# Patient Record
Sex: Female | Born: 1970
Health system: Southern US, Community
[De-identification: ages and names within clinical notes are randomized; demographics above are authoritative.]

## PROBLEM LIST (undated history)

## (undated) DIAGNOSIS — I10 Essential (primary) hypertension: Secondary | ICD-10-CM

## (undated) HISTORY — PX: WRIST SURGERY: SHX841

---

## 2008-06-05 ENCOUNTER — Emergency Department (HOSPITAL_COMMUNITY): Admission: EM | Admit: 2008-06-05 | Discharge: 2008-06-05 | Payer: Self-pay | Admitting: Family Medicine

## 2008-09-01 ENCOUNTER — Other Ambulatory Visit: Admission: RE | Admit: 2008-09-01 | Discharge: 2008-09-01 | Payer: Self-pay | Admitting: Family Medicine

## 2010-07-26 LAB — HIV ANTIBODY (ROUTINE TESTING W REFLEX): HIV: NONREACTIVE

## 2010-07-26 LAB — WET PREP, GENITAL: Yeast Wet Prep HPF POC: NONE SEEN

## 2011-09-28 ENCOUNTER — Emergency Department (HOSPITAL_COMMUNITY): Payer: No Typology Code available for payment source

## 2011-09-28 ENCOUNTER — Emergency Department (HOSPITAL_COMMUNITY)
Admission: EM | Admit: 2011-09-28 | Discharge: 2011-09-29 | Disposition: A | Discharge status: Home / Self Care | Payer: No Typology Code available for payment source | Attending: Emergency Medicine | Admitting: Emergency Medicine

## 2011-09-28 ENCOUNTER — Encounter (HOSPITAL_COMMUNITY): Payer: Self-pay | Admitting: Emergency Medicine

## 2011-09-28 DIAGNOSIS — E119 Type 2 diabetes mellitus without complications: Secondary | ICD-10-CM | POA: Insufficient documentation

## 2011-09-28 DIAGNOSIS — I10 Essential (primary) hypertension: Secondary | ICD-10-CM | POA: Insufficient documentation

## 2011-09-28 DIAGNOSIS — M25539 Pain in unspecified wrist: Secondary | ICD-10-CM | POA: Insufficient documentation

## 2011-09-28 DIAGNOSIS — M25519 Pain in unspecified shoulder: Secondary | ICD-10-CM | POA: Insufficient documentation

## 2011-09-28 HISTORY — DX: Essential (primary) hypertension: I10
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
## 2011-09-29 MED ORDER — CYCLOBENZAPRINE HCL 10 MG PO TABS: 10.0000 mg | ORAL_TABLET | Freq: Two times a day (BID) | ORAL | Status: AC | PRN

## 2011-09-29 MED ORDER — IBUPROFEN 600 MG PO TABS: 600.0000 mg | ORAL_TABLET | Freq: Four times a day (QID) | ORAL | Status: AC | PRN
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
## 2011-09-30 NOTE — ED Provider Notes (Signed)
Medical screening examination/treatment/procedure(s) were performed by non-physician practitioner and as supervising physician I was immediately available for consultation/collaboration.  Juliet Rude. Rubin Payor, MD 09/30/11 [phone]

## 2012-07-11 ENCOUNTER — Other Ambulatory Visit: Payer: Self-pay

## 2012-07-11 DIAGNOSIS — Z1231 Encounter for screening mammogram for malignant neoplasm of breast: Secondary | ICD-10-CM

## 2012-08-16 ENCOUNTER — Ambulatory Visit
Admission: RE | Admit: 2012-08-16 | Discharge: 2012-08-16 | Disposition: A | Discharge status: Home / Self Care | Payer: Self-pay | Source: Ambulatory Visit

## 2012-08-16 DIAGNOSIS — Z1231 Encounter for screening mammogram for malignant neoplasm of breast: Secondary | ICD-10-CM

## 2014-05-29 ENCOUNTER — Other Ambulatory Visit: Payer: Self-pay | Admitting: Sports Medicine

## 2014-05-29 DIAGNOSIS — M25561 Pain in right knee: Secondary | ICD-10-CM

## 2014-06-07 ENCOUNTER — Ambulatory Visit
Admission: RE | Admit: 2014-06-07 | Discharge: 2014-06-07 | Disposition: A | Discharge status: Home / Self Care | Payer: Managed Care, Other (non HMO) | Source: Ambulatory Visit | Attending: Sports Medicine | Admitting: Sports Medicine

## 2014-06-07 DIAGNOSIS — M25561 Pain in right knee: Secondary | ICD-10-CM

## 2014-09-09 ENCOUNTER — Other Ambulatory Visit: Payer: Self-pay | Admitting: Family Medicine

## 2014-09-09 ENCOUNTER — Other Ambulatory Visit (HOSPITAL_COMMUNITY)
Admission: RE | Admit: 2014-09-09 | Discharge: 2014-09-09 | Disposition: A | Discharge status: Home / Self Care | Payer: Managed Care, Other (non HMO) | Source: Ambulatory Visit | Attending: Family Medicine | Admitting: Family Medicine

## 2014-09-09 DIAGNOSIS — N852 Hypertrophy of uterus: Secondary | ICD-10-CM

## 2014-09-09 DIAGNOSIS — Z1231 Encounter for screening mammogram for malignant neoplasm of breast: Secondary | ICD-10-CM

## 2014-09-09 DIAGNOSIS — Z01419 Encounter for gynecological examination (general) (routine) without abnormal findings: Secondary | ICD-10-CM | POA: Insufficient documentation

## 2014-09-14 ENCOUNTER — Ambulatory Visit
Admission: RE | Admit: 2014-09-14 | Discharge: 2014-09-14 | Disposition: A | Discharge status: Home / Self Care | Payer: Managed Care, Other (non HMO) | Source: Ambulatory Visit | Attending: Family Medicine | Admitting: Family Medicine

## 2014-09-14 DIAGNOSIS — Z1231 Encounter for screening mammogram for malignant neoplasm of breast: Secondary | ICD-10-CM

## 2014-09-14 DIAGNOSIS — N852 Hypertrophy of uterus: Secondary | ICD-10-CM

## 2014-09-14 LAB — CYTOLOGY - PAP

## 2015-02-03 ENCOUNTER — Other Ambulatory Visit: Payer: Self-pay | Admitting: Family Medicine

## 2015-02-03 DIAGNOSIS — N63 Unspecified lump in unspecified breast: Secondary | ICD-10-CM

## 2015-02-09 ENCOUNTER — Ambulatory Visit
Admission: RE | Admit: 2015-02-09 | Discharge: 2015-02-09 | Disposition: A | Discharge status: Home / Self Care | Payer: Managed Care, Other (non HMO) | Source: Ambulatory Visit | Attending: Family Medicine | Admitting: Family Medicine

## 2015-02-09 DIAGNOSIS — N63 Unspecified lump in unspecified breast: Secondary | ICD-10-CM

## 2015-06-14 MED FILL — FARXIGA 5 MG TABLET: 5 | 30 days supply | Qty: 30 | Fill #0

## 2015-06-18 DIAGNOSIS — E119 Type 2 diabetes mellitus without complications: Secondary | ICD-10-CM | POA: Diagnosis not present

## 2015-06-18 DIAGNOSIS — Z7984 Long term (current) use of oral hypoglycemic drugs: Secondary | ICD-10-CM | POA: Diagnosis not present

## 2015-07-02 MED FILL — FLUCONAZOLE 150 MG TABLET: 150 | 1 days supply | Qty: 1 | Fill #0

## 2015-07-19 MED FILL — ROSUVASTATIN CALCIUM 20 MG: 20 | 90 days supply | Qty: 90 | Fill #0

## 2015-07-19 MED FILL — metFORMIN HCL 1000 MG TABS: 1000 | 90 days supply | Qty: 180 | Fill #0

## 2015-07-19 MED FILL — FARXIGA 5 MG TABLET: 5 | 90 days supply | Qty: 90 | Fill #0

## 2015-07-19 MED FILL — GLIMEPIRIDE 2 MG TABLET: 2 | 90 days supply | Qty: 90 | Fill #0

## 2015-07-19 MED FILL — TRIAMTERENE-HCTZ 37.5-25 MG: 37.5-25 | 90 days supply | Qty: 90 | Fill #0

## 2015-09-10 DIAGNOSIS — E7800 Pure hypercholesterolemia, unspecified: Secondary | ICD-10-CM | POA: Diagnosis not present

## 2015-09-10 DIAGNOSIS — I10 Essential (primary) hypertension: Secondary | ICD-10-CM | POA: Diagnosis not present

## 2015-09-10 DIAGNOSIS — M25569 Pain in unspecified knee: Secondary | ICD-10-CM | POA: Diagnosis not present

## 2015-09-10 DIAGNOSIS — E669 Obesity, unspecified: Secondary | ICD-10-CM | POA: Diagnosis not present

## 2015-09-10 DIAGNOSIS — E119 Type 2 diabetes mellitus without complications: Secondary | ICD-10-CM | POA: Diagnosis not present

## 2015-09-10 DIAGNOSIS — Z0000 Encounter for general adult medical examination without abnormal findings: Secondary | ICD-10-CM | POA: Diagnosis not present

## 2015-09-10 DIAGNOSIS — D259 Leiomyoma of uterus, unspecified: Secondary | ICD-10-CM | POA: Diagnosis not present

## 2015-09-10 DIAGNOSIS — Z7984 Long term (current) use of oral hypoglycemic drugs: Secondary | ICD-10-CM | POA: Diagnosis not present

## 2015-09-10 MED FILL — TRUE METRIX GLUCOSE TEST ST: 90 days supply | Qty: 100 | Fill #0

## 2015-09-10 MED FILL — TRUEplus LANCETS 30G MISC: 90 days supply | Qty: 100 | Fill #0

## 2015-11-01 MED FILL — ROSUVASTATIN CALCIUM 20 MG: 20 | 90 days supply | Qty: 90 | Fill #1

## 2015-11-01 MED FILL — TRIAMTERENE/HCTZ 37.5/25 TB: 37.5-25 | 90 days supply | Qty: 90 | Fill #1

## 2015-11-01 MED FILL — GLIMEPIRIDE 2 MG TABLET: 2 | 90 days supply | Qty: 90 | Fill #1

## 2015-11-04 MED FILL — FARXIGA 5 MG TABLET: 5 | 90 days supply | Qty: 90 | Fill #1

## 2016-02-03 MED FILL — ROSUVASTATIN CALCIUM 20 MG: 20 | 90 days supply | Qty: 90 | Fill #0

## 2016-02-03 MED FILL — GLIMEPIRIDE 2 MG TABLET: 2 | 90 days supply | Qty: 90 | Fill #0

## 2016-02-03 MED FILL — TRIAMTERENE-HCTZ 37.5-25 MG: 37.5-25 | 90 days supply | Qty: 90 | Fill #0

## 2016-02-03 MED FILL — FARXIGA 5 MG TABLET: 5 | 90 days supply | Qty: 90 | Fill #1

## 2016-02-09 DIAGNOSIS — M7710 Lateral epicondylitis, unspecified elbow: Secondary | ICD-10-CM | POA: Diagnosis not present

## 2016-02-09 MED FILL — ETODOLAC 400 MG TABLET: 400 | 30 days supply | Qty: 60 | Fill #0

## 2016-02-29 MED FILL — metFORMIN HCL 1000 MG TABS: 1000 | 90 days supply | Qty: 180 | Fill #1

## 2016-03-14 DIAGNOSIS — M7710 Lateral epicondylitis, unspecified elbow: Secondary | ICD-10-CM | POA: Diagnosis not present

## 2016-03-14 DIAGNOSIS — E669 Obesity, unspecified: Secondary | ICD-10-CM | POA: Diagnosis not present

## 2016-03-14 DIAGNOSIS — E119 Type 2 diabetes mellitus without complications: Secondary | ICD-10-CM | POA: Diagnosis not present

## 2016-03-14 DIAGNOSIS — E7800 Pure hypercholesterolemia, unspecified: Secondary | ICD-10-CM | POA: Diagnosis not present

## 2016-03-14 DIAGNOSIS — I10 Essential (primary) hypertension: Secondary | ICD-10-CM | POA: Diagnosis not present

## 2016-04-17 MED FILL — FARXIGA 10 MG TABLET: 10 | 90 days supply | Qty: 90 | Fill #0

## 2016-05-04 MED FILL — GLIMEPIRIDE 2 MG TABLET: 2 | 90 days supply | Qty: 90 | Fill #1

## 2016-05-04 MED FILL — ROSUVASTATIN CALCIUM 20 MG: 20 | 90 days supply | Qty: 90 | Fill #1

## 2016-05-05 MED FILL — TRIAMTERENE-HCTZ 37.5-25 MG: 37.5-25 | 90 days supply | Qty: 90 | Fill #1

## 2016-06-21 DIAGNOSIS — H524 Presbyopia: Secondary | ICD-10-CM | POA: Diagnosis not present

## 2016-06-22 MED FILL — metFORMIN HCL 1000 MG TABS: 1000 | 90 days supply | Qty: 180 | Fill #0

## 2016-07-17 MED FILL — FARXIGA 10 MG TABLET: 10 | 90 days supply | Qty: 90 | Fill #1

## 2016-08-03 MED FILL — TRIAMTERENE-HCTZ 37.5-25 MG: 37.5-25 | 90 days supply | Qty: 90 | Fill #2

## 2016-08-03 MED FILL — GLIMEPIRIDE 2 MG TABLET: 2 | 90 days supply | Qty: 90 | Fill #2

## 2016-08-03 MED FILL — ROSUVASTATIN CALCIUM 20 MG: 20 | 90 days supply | Qty: 90 | Fill #2

## 2016-08-04 MED FILL — ACCU-CHEK GUIDE TEST STRIP: 90 days supply | Qty: 100 | Fill #0

## 2016-09-14 DIAGNOSIS — E7800 Pure hypercholesterolemia, unspecified: Secondary | ICD-10-CM | POA: Diagnosis not present

## 2016-09-14 DIAGNOSIS — N951 Menopausal and female climacteric states: Secondary | ICD-10-CM | POA: Diagnosis not present

## 2016-09-14 DIAGNOSIS — E669 Obesity, unspecified: Secondary | ICD-10-CM | POA: Diagnosis not present

## 2016-09-14 DIAGNOSIS — I10 Essential (primary) hypertension: Secondary | ICD-10-CM | POA: Diagnosis not present

## 2016-09-14 DIAGNOSIS — Z7984 Long term (current) use of oral hypoglycemic drugs: Secondary | ICD-10-CM | POA: Diagnosis not present

## 2016-09-14 DIAGNOSIS — E1165 Type 2 diabetes mellitus with hyperglycemia: Secondary | ICD-10-CM | POA: Diagnosis not present

## 2016-09-26 MED FILL — metFORMIN HCL 1000 MG TABS: 1000 | 90 days supply | Qty: 180 | Fill #1

## 2016-10-17 MED FILL — FARXIGA 10 MG TABLET: 10 | 90 days supply | Qty: 90 | Fill #0

## 2016-11-01 MED FILL — ROSUVASTATIN CALCIUM 20 MG: 20 | 90 days supply | Qty: 90 | Fill #0

## 2016-11-01 MED FILL — ACCU-CHEK GUIDE TEST STRIP: 90 days supply | Qty: 100 | Fill #1

## 2016-11-01 MED FILL — GLIMEPIRIDE 2 MG TABLET: 2 | 90 days supply | Qty: 90 | Fill #0

## 2016-11-01 MED FILL — TRIAMTERENE-HCTZ 37.5-25 MG: 37.5-25 | 90 days supply | Qty: 90 | Fill #0

## 2016-12-15 DIAGNOSIS — L259 Unspecified contact dermatitis, unspecified cause: Secondary | ICD-10-CM | POA: Diagnosis not present

## 2016-12-15 MED FILL — MOMETASONE FUROATE 0.1% CRM: 0.1 | 30 days supply | Qty: 30 | Fill #0

## 2017-01-05 MED FILL — metFORMIN HCL 1000 MG TABS: 1000 | 90 days supply | Qty: 180 | Fill #2

## 2017-01-15 MED FILL — FARXIGA 10 MG TABLET: 10 | 90 days supply | Qty: 90 | Fill #1

## 2017-01-29 MED FILL — ROSUVASTATIN CALCIUM 20 MG: 20 | 90 days supply | Qty: 90 | Fill #1

## 2017-01-29 MED FILL — TRIAMTERENE/HCTZ 37.5/25 TB: 37.5-25 | 90 days supply | Qty: 90 | Fill #1

## 2017-01-29 MED FILL — GLIMEPIRIDE 2 MG TABLET: 2 | 90 days supply | Qty: 90 | Fill #1

## 2017-03-16 DIAGNOSIS — E119 Type 2 diabetes mellitus without complications: Secondary | ICD-10-CM | POA: Diagnosis not present

## 2017-03-16 DIAGNOSIS — E7800 Pure hypercholesterolemia, unspecified: Secondary | ICD-10-CM | POA: Diagnosis not present

## 2017-03-16 DIAGNOSIS — I10 Essential (primary) hypertension: Secondary | ICD-10-CM | POA: Diagnosis not present

## 2017-03-16 DIAGNOSIS — Z0000 Encounter for general adult medical examination without abnormal findings: Secondary | ICD-10-CM | POA: Diagnosis not present

## 2017-03-16 DIAGNOSIS — R232 Flushing: Secondary | ICD-10-CM | POA: Diagnosis not present

## 2017-03-16 DIAGNOSIS — E669 Obesity, unspecified: Secondary | ICD-10-CM | POA: Diagnosis not present

## 2017-04-20 MED FILL — metFORMIN HCL 1000 MG TABS: 1000 | 90 days supply | Qty: 180 | Fill #0

## 2017-04-20 MED FILL — FARXIGA 10 MG TABLET: 10 | 90 days supply | Qty: 90 | Fill #0

## 2017-04-20 MED FILL — GLIMEPIRIDE 2 MG TABLET: 2 | 90 days supply | Qty: 90 | Fill #0

## 2017-04-20 MED FILL — ROSUVASTATIN CALCIUM 20 MG: 20 | 90 days supply | Qty: 90 | Fill #0

## 2017-05-07 MED FILL — TRIAMTERENE/HCTZ 37.5/25 TB: 37.5-25 | 90 days supply | Qty: 90 | Fill #0

## 2017-06-27 MED FILL — ACCU-CHEK GUIDE TEST STRIP: 90 days supply | Qty: 100 | Fill #0

## 2017-07-16 MED FILL — FARXIGA 10 MG TABLET: 10 | 90 days supply | Qty: 90 | Fill #1

## 2017-08-01 MED FILL — ROSUVASTATIN CALCIUM 20 MG: 20 | 90 days supply | Qty: 90 | Fill #1

## 2017-08-01 MED FILL — GLIMEPIRIDE 2 MG TABLET: 2 | 90 days supply | Qty: 90 | Fill #1

## 2017-08-01 MED FILL — metFORMIN HCL 1000 MG TABS: 1000 | 90 days supply | Qty: 180 | Fill #1

## 2017-08-01 MED FILL — TRIAMTERENE-HCTZ 37.5-25 MG: 37.5-25 | 90 days supply | Qty: 90 | Fill #1

## 2017-08-02 DIAGNOSIS — H1010 Acute atopic conjunctivitis, unspecified eye: Secondary | ICD-10-CM | POA: Diagnosis not present

## 2017-08-02 MED FILL — PREDNISOLONE AC 1% EYE DROP: 1 | 50 days supply | Qty: 5 | Fill #0

## 2017-08-24 DIAGNOSIS — H524 Presbyopia: Secondary | ICD-10-CM | POA: Diagnosis not present

## 2017-09-20 DIAGNOSIS — E119 Type 2 diabetes mellitus without complications: Secondary | ICD-10-CM | POA: Diagnosis not present

## 2017-09-20 DIAGNOSIS — E7800 Pure hypercholesterolemia, unspecified: Secondary | ICD-10-CM | POA: Diagnosis not present

## 2017-09-20 DIAGNOSIS — I10 Essential (primary) hypertension: Secondary | ICD-10-CM | POA: Diagnosis not present

## 2017-09-20 DIAGNOSIS — E669 Obesity, unspecified: Secondary | ICD-10-CM | POA: Diagnosis not present

## 2017-10-18 MED FILL — FARXIGA 10 MG TABLET: 10 | 90 days supply | Qty: 90 | Fill #0

## 2017-11-01 MED FILL — TRIAMTERENE/HCTZ 37.5/25 CP: 37.5-25 | 90 days supply | Qty: 90 | Fill #0

## 2017-11-01 MED FILL — ROSUVASTATIN CALCIUM 20 MG: 20 | 90 days supply | Qty: 90 | Fill #0

## 2017-11-01 MED FILL — GLIMEPIRIDE 2 MG TABLET: 2 | 90 days supply | Qty: 90 | Fill #0

## 2017-11-02 MED FILL — metFORMIN HCL 1000 MG TABS: 1000 | 90 days supply | Qty: 180 | Fill #0

## 2017-11-13 MED FILL — ACCU-CHEK GUIDE STRP: 90 days supply | Qty: 100 | Fill #1

## 2017-12-04 ENCOUNTER — Other Ambulatory Visit: Payer: Self-pay | Admitting: Family Medicine

## 2017-12-04 ENCOUNTER — Other Ambulatory Visit (HOSPITAL_COMMUNITY)
Admission: RE | Admit: 2017-12-04 | Discharge: 2017-12-04 | Disposition: A | Discharge status: Home / Self Care | Payer: 59 | Source: Ambulatory Visit | Attending: Family Medicine | Admitting: Family Medicine

## 2017-12-04 DIAGNOSIS — D259 Leiomyoma of uterus, unspecified: Secondary | ICD-10-CM | POA: Diagnosis not present

## 2017-12-04 DIAGNOSIS — N951 Menopausal and female climacteric states: Secondary | ICD-10-CM | POA: Diagnosis not present

## 2017-12-04 DIAGNOSIS — Z01419 Encounter for gynecological examination (general) (routine) without abnormal findings: Secondary | ICD-10-CM | POA: Insufficient documentation

## 2017-12-04 DIAGNOSIS — Z124 Encounter for screening for malignant neoplasm of cervix: Secondary | ICD-10-CM | POA: Diagnosis not present

## 2017-12-04 MED FILL — PREMPRO 0.625-2.5 MG TABLET: 0.625-2.5 | 28 days supply | Qty: 28 | Fill #0

## 2017-12-07 LAB — CYTOLOGY - PAP
Adequacy: ABSENT
DIAGNOSIS: NEGATIVE

## 2017-12-31 MED FILL — PREMPRO 0.625-2.5 MG TABLET: 0.625-2.5 | 28 days supply | Qty: 28 | Fill #1

## 2018-01-14 MED FILL — FARXIGA 10 MG TABLET: 10 | 90 days supply | Qty: 90 | Fill #1

## 2018-01-28 MED FILL — GLIMEPIRIDE 2 MG TABLET: 2 | 90 days supply | Qty: 90 | Fill #1

## 2018-01-28 MED FILL — TRIAMTERENE/HCTZ 37.5/25 CP: 37.5-25 | 90 days supply | Qty: 90 | Fill #1

## 2018-01-28 MED FILL — PREMPRO 0.625-2.5 MG TABLET: 0.625-2.5 | 28 days supply | Qty: 28 | Fill #2

## 2018-01-28 MED FILL — ROSUVASTATIN CALCIUM 20 MG: 20 | 90 days supply | Qty: 90 | Fill #1

## 2018-02-18 MED FILL — metFORMIN HCL 1000 MG TABS: 1000 | 90 days supply | Qty: 180 | Fill #1

## 2018-02-25 MED FILL — PREMPRO 0.625-2.5 MG TABLET: 0.625-2.5 | 28 days supply | Qty: 28 | Fill #3

## 2018-03-26 DIAGNOSIS — Z0000 Encounter for general adult medical examination without abnormal findings: Secondary | ICD-10-CM | POA: Diagnosis not present

## 2018-03-26 DIAGNOSIS — E119 Type 2 diabetes mellitus without complications: Secondary | ICD-10-CM | POA: Diagnosis not present

## 2018-03-26 DIAGNOSIS — E669 Obesity, unspecified: Secondary | ICD-10-CM | POA: Diagnosis not present

## 2018-03-26 DIAGNOSIS — D259 Leiomyoma of uterus, unspecified: Secondary | ICD-10-CM | POA: Diagnosis not present

## 2018-03-26 DIAGNOSIS — I10 Essential (primary) hypertension: Secondary | ICD-10-CM | POA: Diagnosis not present

## 2018-03-26 DIAGNOSIS — N951 Menopausal and female climacteric states: Secondary | ICD-10-CM | POA: Diagnosis not present

## 2018-03-26 DIAGNOSIS — E7800 Pure hypercholesterolemia, unspecified: Secondary | ICD-10-CM | POA: Diagnosis not present

## 2018-03-26 MED FILL — PREMPRO 0.625-2.5 MG TABLET: 0.625-2.5 | 28 days supply | Qty: 28 | Fill #4

## 2018-03-27 ENCOUNTER — Other Ambulatory Visit: Payer: Self-pay | Admitting: Family Medicine

## 2018-03-27 DIAGNOSIS — Z1231 Encounter for screening mammogram for malignant neoplasm of breast: Secondary | ICD-10-CM

## 2018-04-12 MED FILL — FARXIGA 10 MG TABLET: 10 | 90 days supply | Qty: 90 | Fill #2

## 2018-04-22 MED FILL — GLIMEPIRIDE 2 MG TABLET: 2 | 90 days supply | Qty: 90 | Fill #2

## 2018-04-22 MED FILL — TRIAMTERENE/HCTZ 37.5/25 CP: 37.5-25 | 90 days supply | Qty: 90 | Fill #2

## 2018-04-22 MED FILL — PREMPRO 0.625-2.5 MG TABLET: 0.625-2.5 | 28 days supply | Qty: 28 | Fill #5

## 2018-04-22 MED FILL — ROSUVASTATIN CALCIUM 20 MG: 20 | 90 days supply | Qty: 90 | Fill #2

## 2018-05-03 ENCOUNTER — Ambulatory Visit
Admission: RE | Admit: 2018-05-03 | Discharge: 2018-05-03 | Disposition: A | Discharge status: Home / Self Care | Payer: 59 | Source: Ambulatory Visit | Attending: Family Medicine | Admitting: Family Medicine

## 2018-05-03 DIAGNOSIS — Z1231 Encounter for screening mammogram for malignant neoplasm of breast: Secondary | ICD-10-CM | POA: Diagnosis not present

## 2018-05-24 MED FILL — PREMPRO 0.625-2.5 MG TABLET: 0.625-2.5 | 28 days supply | Qty: 28 | Fill #6

## 2018-05-27 MED FILL — metFORMIN HCL 1000 MG TABS: 1000 | 90 days supply | Qty: 180 | Fill #2 | Status: TO

## 2018-05-27 MED FILL — ACCU-CHEK GUIDE STRP: 90 days supply | Qty: 100 | Fill #2

## 2018-06-22 MED FILL — PREMPRO 0.625-2.5 MG TABLET: 0.625-2.5 | 28 days supply | Qty: 28 | Fill #7 | Status: TO

## 2018-07-09 MED FILL — ROSUVASTATIN CALCIUM 20 MG: 20 | 90 days supply | Qty: 90 | Fill #0

## 2018-07-09 MED FILL — GLIMEPIRIDE 2 MG TABLET: 2 | 90 days supply | Qty: 90 | Fill #0

## 2018-07-09 MED FILL — FARXIGA 10 MG TABLET: 10 | 90 days supply | Qty: 90 | Fill #0

## 2018-07-09 MED FILL — PREMPRO 0.625-2.5 MG TABLET: 0.625-2.5 | 28 days supply | Qty: 28 | Fill #0

## 2018-07-09 MED FILL — TRIAMTERENE/HCTZ 37.5/25 CP: 37.5-25 | 90 days supply | Qty: 90 | Fill #0

## 2018-07-31 MED FILL — PREMPRO 0.625-2.5 MG TABLET: 0.625-2.5 | 28 days supply | Qty: 28 | Fill #1

## 2018-08-07 MED FILL — metFORMIN HCL 1000 MG TABS: 1000 | 90 days supply | Qty: 180 | Fill #0

## 2018-08-07 MED FILL — ACCU-CHEK GUIDE TEST STRIP: 90 days supply | Qty: 100 | Fill #0

## 2018-08-28 MED FILL — PREMPRO 0.625-2.5 MG TABLET: 0.625-2.5 | 28 days supply | Qty: 28 | Fill #2

## 2018-09-13 DIAGNOSIS — L0390 Cellulitis, unspecified: Secondary | ICD-10-CM | POA: Diagnosis not present

## 2018-09-13 MED FILL — CEPHALEXIN 500 MG CAPSULE: 500 | 5 days supply | Qty: 10 | Fill #0

## 2018-09-20 DIAGNOSIS — H524 Presbyopia: Secondary | ICD-10-CM | POA: Diagnosis not present

## 2018-09-25 MED FILL — PREMPRO 0.625-2.5 MG TABLET: 0.625-2.5 | 28 days supply | Qty: 28 | Fill #3

## 2018-10-01 DIAGNOSIS — I10 Essential (primary) hypertension: Secondary | ICD-10-CM | POA: Diagnosis not present

## 2018-10-01 DIAGNOSIS — E669 Obesity, unspecified: Secondary | ICD-10-CM | POA: Diagnosis not present

## 2018-10-01 DIAGNOSIS — E119 Type 2 diabetes mellitus without complications: Secondary | ICD-10-CM | POA: Diagnosis not present

## 2018-10-01 DIAGNOSIS — E7800 Pure hypercholesterolemia, unspecified: Secondary | ICD-10-CM | POA: Diagnosis not present

## 2018-10-01 DIAGNOSIS — Z7984 Long term (current) use of oral hypoglycemic drugs: Secondary | ICD-10-CM | POA: Diagnosis not present

## 2018-10-01 DIAGNOSIS — N951 Menopausal and female climacteric states: Secondary | ICD-10-CM | POA: Diagnosis not present

## 2018-10-01 MED FILL — GLIMEPIRIDE 2 MG TABLET: 2 | 90 days supply | Qty: 90 | Fill #0

## 2018-10-01 MED FILL — TRIAMTERENE/HCTZ 37.5/25 CP: 37.5-25 | 90 days supply | Qty: 90 | Fill #0

## 2018-10-01 MED FILL — FARXIGA 10 MG TABLET: 10 | 90 days supply | Qty: 90 | Fill #0

## 2018-10-01 MED FILL — ROSUVASTATIN CALCIUM 20 MG: 20 | 90 days supply | Qty: 90 | Fill #0

## 2018-10-09 DIAGNOSIS — E7800 Pure hypercholesterolemia, unspecified: Secondary | ICD-10-CM | POA: Diagnosis not present

## 2018-10-09 DIAGNOSIS — Z7984 Long term (current) use of oral hypoglycemic drugs: Secondary | ICD-10-CM | POA: Diagnosis not present

## 2018-10-09 DIAGNOSIS — E119 Type 2 diabetes mellitus without complications: Secondary | ICD-10-CM | POA: Diagnosis not present

## 2018-10-23 MED FILL — PREMPRO 0.625-2.5 MG TABLET: 0.625-2.5 | 28 days supply | Qty: 28 | Fill #4

## 2018-10-30 MED FILL — metFORMIN HCL 1000 MG TABS: 1000 | 90 days supply | Qty: 180 | Fill #0

## 2018-11-20 MED FILL — PREMPRO 0.625-2.5 MG TABLET: 0.625-2.5 | 28 days supply | Qty: 28 | Fill #0

## 2019-01-06 MED FILL — PREMPRO 0.625-2.5 MG TABLET: 0.625-2.5 | 28 days supply | Qty: 28 | Fill #1

## 2019-01-20 MED FILL — TRIAMTERENE/HCTZ 37.5/25 CP: 37.5-25 | 90 days supply | Qty: 90 | Fill #1

## 2019-01-20 MED FILL — ROSUVASTATIN CALCIUM 20 MG: 20 | 90 days supply | Qty: 90 | Fill #1

## 2019-01-20 MED FILL — GLIMEPIRIDE 2 MG TABLET: 2 | 90 days supply | Qty: 90 | Fill #1

## 2019-02-03 MED FILL — PREMPRO 0.625-2.5 MG TABLET: 0.625-2.5 | 28 days supply | Qty: 28 | Fill #2

## 2019-03-03 MED FILL — PREMPRO 0.625-2.5 MG TABLET: 0.625-2.5 | 28 days supply | Qty: 28 | Fill #3

## 2019-03-04 IMAGING — MG DIGITAL SCREENING BILATERAL MAMMOGRAM WITH CAD
5 series · 5 of 5 positions shown · non-contrast
Comparison: Previous exam(s).

CLINICAL DATA: Screening.

EXAM:
DIGITAL SCREENING BILATERAL MAMMOGRAM WITH CAD

[R MLO]
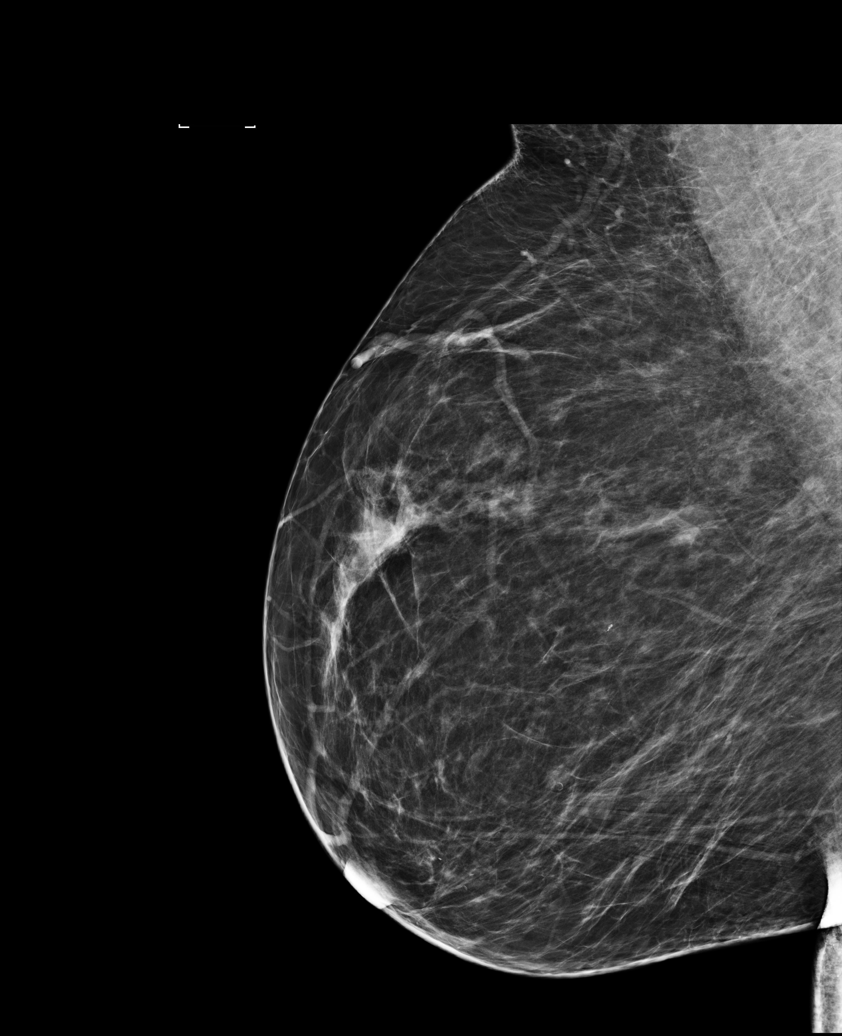

[R CC (1 of 2)]
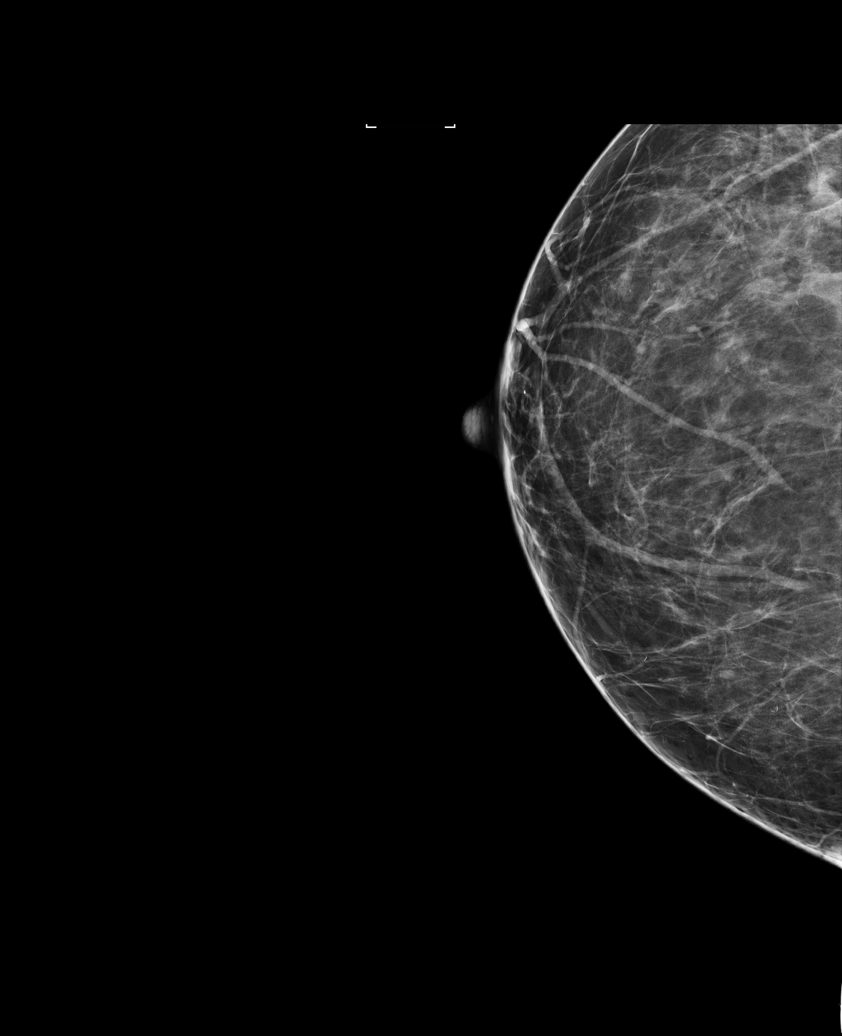

[L MLO]
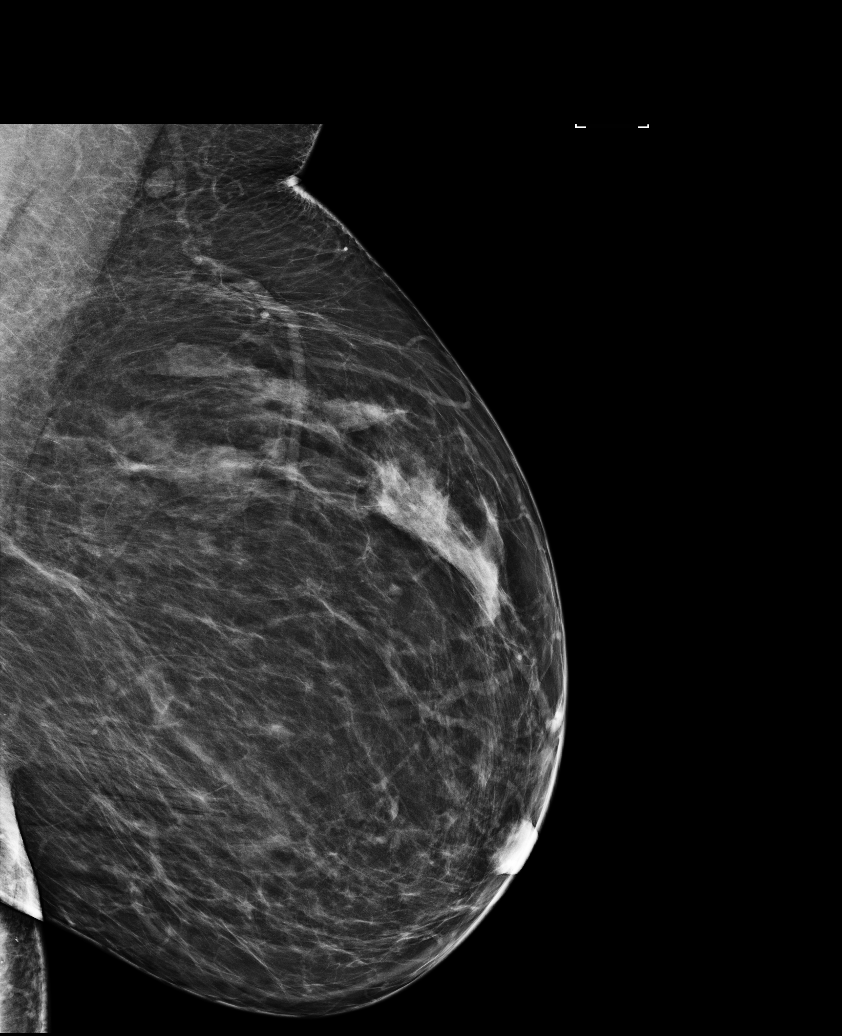

[L CC]
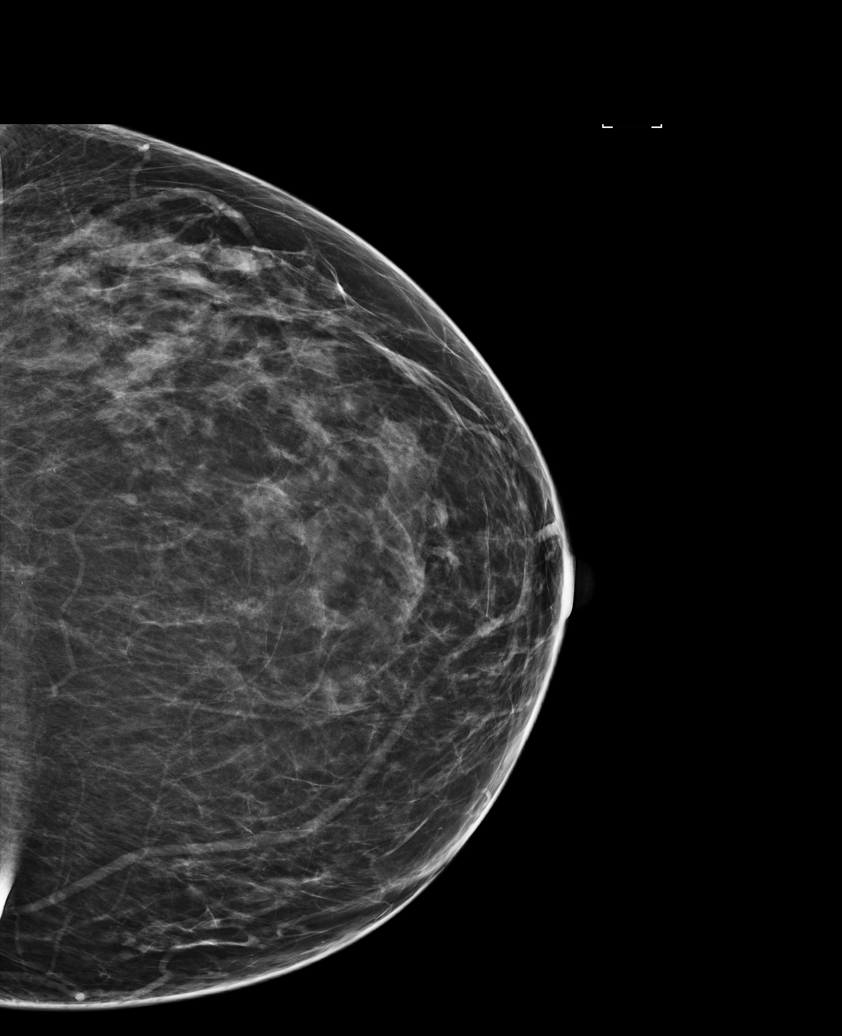

[R CC (2 of 2)]
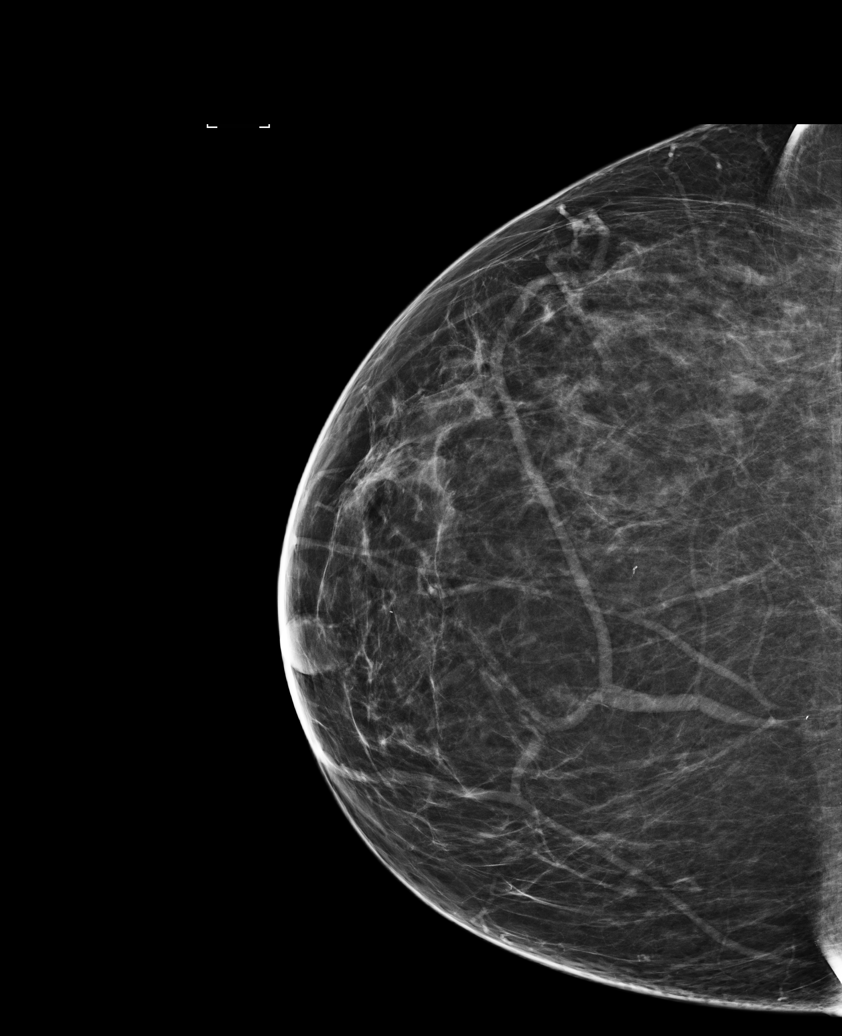

[5 of 5 positions shown; findings below may reference images not displayed]

ACR Breast Density Category b: There are scattered areas of
fibroglandular density.
FINDINGS: There are no findings suspicious for malignancy. Images were
processed with CAD.
IMPRESSION: No mammographic evidence of malignancy. A result letter of this
screening mammogram will be mailed directly to the patient.

RECOMMENDATION:
Screening mammogram in one year. (Code:AS-G-LCT)

BI-RADS CATEGORY  1: Negative.

## 2019-04-02 MED FILL — PREMPRO 0.625-2.5 MG TABLET: 0.625-2.5 | 28 days supply | Qty: 28 | Fill #0

## 2019-04-02 MED FILL — metFORMIN HCL 1000 MG TABS: 1000 | 90 days supply | Qty: 180 | Fill #0

## 2019-04-02 MED FILL — FARXIGA 10 MG TABLET: 10 | 90 days supply | Qty: 90 | Fill #0

## 2019-04-21 MED FILL — TRIAMTERENE/HCTZ 37.5/25 CP: 37.5-25 | 90 days supply | Qty: 90 | Fill #0

## 2019-04-21 MED FILL — ROSUVASTATIN CALCIUM 20 MG: 20 | 90 days supply | Qty: 90 | Fill #0

## 2019-04-21 MED FILL — GLIMEPIRIDE 2 MG TABLET: 2 | 90 days supply | Qty: 90 | Fill #0

## 2019-04-21 MED FILL — ONETOUCH VERIO TEST STRIP: 90 days supply | Qty: 100 | Fill #0

## 2019-04-21 MED FILL — ONETOUCH DELICA PLUS LANCET: 90 days supply | Qty: 100 | Fill #0

## 2019-05-05 MED FILL — PREMPRO 0.625-2.5 MG TABLET: 0.625-2.5 | 28 days supply | Qty: 28 | Fill #1

## 2019-05-30 MED FILL — PREMPRO 0.625-2.5 MG TABLET: 0.625-2.5 | 28 days supply | Qty: 28 | Fill #2

## 2019-06-12 ENCOUNTER — Ambulatory Visit: Payer: BC Managed Care – PPO | Attending: Family

## 2019-06-12 DIAGNOSIS — Z23 Encounter for immunization: Secondary | ICD-10-CM | POA: Insufficient documentation

## 2019-06-12 NOTE — Progress Notes (Signed)
   Covid-19 Vaccination Clinic  Name:  Susan Preston    MRN: [id number] DOB: 08/27/1970  06/12/2019  Susan Preston was observed post Covid-19 immunization for 15 minutes without incident. She was provided with Vaccine Information Sheet and instruction to access the V-Safe system.   Susan Preston was instructed to call 911 with any severe reactions post vaccine: Marland Kitchen Difficulty breathing  . Swelling of face and throat  . A fast heartbeat  . A bad rash all over body  . Dizziness and weakness   Immunizations Administered    Name Date Dose VIS Date Route   Moderna COVID-19 Vaccine 06/12/2019  3:12 PM 0.5 mL 03/11/2019 Intramuscular   Manufacturer: Moderna   Lot: [id number]   [id number]

## 2019-06-16 ENCOUNTER — Other Ambulatory Visit: Payer: Self-pay | Admitting: Family Medicine

## 2019-06-16 DIAGNOSIS — Z1231 Encounter for screening mammogram for malignant neoplasm of breast: Secondary | ICD-10-CM

## 2019-06-25 ENCOUNTER — Other Ambulatory Visit: Payer: Self-pay

## 2019-06-25 ENCOUNTER — Ambulatory Visit
Admission: RE | Admit: 2019-06-25 | Discharge: 2019-06-25 | Disposition: A | Discharge status: Home / Self Care | Payer: BC Managed Care – PPO | Source: Ambulatory Visit

## 2019-06-25 DIAGNOSIS — Z1231 Encounter for screening mammogram for malignant neoplasm of breast: Secondary | ICD-10-CM

## 2019-06-26 MED FILL — PREMPRO 0.625-2.5 MG TABLET: 0.625-2.5 | 28 days supply | Qty: 28 | Fill #3

## 2019-07-07 MED FILL — FARXIGA 10 MG TABLET: 10 | 90 days supply | Qty: 90 | Fill #1

## 2019-07-15 ENCOUNTER — Ambulatory Visit: Payer: BC Managed Care – PPO | Attending: Family

## 2019-07-15 DIAGNOSIS — Z23 Encounter for immunization: Secondary | ICD-10-CM

## 2019-07-15 NOTE — Progress Notes (Signed)
   Covid-19 Vaccination Clinic  Name:  Susan Preston    MRN: [id number] DOB: 01-May-1970  07/15/2019  [name] was observed post Covid-19 immunization for 15 minutes without incident. She was provided with Vaccine Information Sheet and instruction to access the V-Safe system.   [name] was instructed to call 911 with any severe reactions post vaccine: Marland Kitchen Difficulty breathing  . Swelling of face and throat  . A fast heartbeat  . A bad rash all over body  . Dizziness and weakness   Immunizations Administered    Name Date Dose VIS Date Route   Moderna COVID-19 Vaccine 07/15/2019  2:59 PM 0.5 mL 03/11/2019 Intramuscular   Manufacturer: Moderna   Lot: [id number]   [id number][id number]

## 2019-07-21 MED FILL — ROSUVASTATIN CALCIUM 20 MG: 20 | 90 days supply | Qty: 90 | Fill #1

## 2019-07-21 MED FILL — TRIAMTERENE/HCTZ 37.5/25 CP: 37.5-25 | 90 days supply | Qty: 90 | Fill #1

## 2019-07-21 MED FILL — GLIMEPIRIDE 2 MG TABLET: 2 | 90 days supply | Qty: 90 | Fill #1

## 2019-07-21 MED FILL — metFORMIN HCL 1000 MG TABS: 1000 | 90 days supply | Qty: 180 | Fill #1

## 2019-08-01 MED FILL — PREMPRO 0.625-2.5 MG TABLET: 0.625-2.5 | 28 days supply | Qty: 28 | Fill #4

## 2019-09-01 MED FILL — ONETOUCH VERIO TEST STRIP: 90 days supply | Qty: 100 | Fill #1

## 2019-09-01 MED FILL — PREMPRO 0.625-2.5 MG TABLET: 0.625-2.5 | 28 days supply | Qty: 28 | Fill #5

## 2019-09-29 MED FILL — PREMPRO 0.625-2.5 MG TABLET: 0.625-2.5 | 28 days supply | Qty: 28 | Fill #6

## 2019-10-08 MED FILL — FARXIGA 10 MG TABLET: 10 | 90 days supply | Qty: 90 | Fill #0

## 2019-10-14 ENCOUNTER — Other Ambulatory Visit (HOSPITAL_COMMUNITY): Payer: Self-pay | Admitting: Family Medicine

## 2019-10-14 MED FILL — TRIAMTERENE/HCTZ 37.5/25 CP: 37.5-25 | 90 days supply | Qty: 90 | Fill #0

## 2019-10-14 MED FILL — metFORMIN HCL 1000 MG TABS: 1000 | 90 days supply | Qty: 180 | Fill #0

## 2019-10-14 MED FILL — GLIMEPIRIDE 2 MG TABLET: 2 | 90 days supply | Qty: 90 | Fill #0

## 2019-10-14 MED FILL — ROSUVASTATIN CALCIUM 20 MG: 20 | 90 days supply | Qty: 90 | Fill #0

## 2019-10-29 MED FILL — PREMPRO 0.625-2.5 MG TABLET: 0.625-2.5 | 28 days supply | Qty: 28 | Fill #7

## 2019-12-02 ENCOUNTER — Other Ambulatory Visit (HOSPITAL_COMMUNITY): Payer: Self-pay | Admitting: Family Medicine

## 2019-12-02 MED FILL — PREMPRO 0.625-2.5 MG TABLET: 0.625-2.5 | 28 days supply | Qty: 28 | Fill #0

## 2019-12-02 MED FILL — ONETOUCH DELICA PLUS LANCET: 90 days supply | Qty: 100 | Fill #1

## 2019-12-02 MED FILL — ONETOUCH VERIO TEST STRIP: 90 days supply | Qty: 100 | Fill #2

## 2020-01-08 MED FILL — GLIMEPIRIDE 2 MG TABLET: 2 | 90 days supply | Qty: 90 | Fill #1

## 2020-01-08 MED FILL — ROSUVASTATIN CALCIUM 20 MG: 20 | 90 days supply | Qty: 90 | Fill #1

## 2020-01-08 MED FILL — TRIAMTERENE/HCTZ 37.5/25 CP: 37.5-25 | 90 days supply | Qty: 90 | Fill #1

## 2020-01-08 MED FILL — FARXIGA 10 MG TABLET: 10 | 90 days supply | Qty: 90 | Fill #1

## 2020-01-08 MED FILL — PREMPRO 0.625-2.5 MG TABLET: 0.625-2.5 | 28 days supply | Qty: 28 | Fill #1

## 2020-02-05 MED FILL — PREMPRO 0.625-2.5 MG TABLET: 0.625-2.5 | 28 days supply | Qty: 28 | Fill #2

## 2020-02-05 MED FILL — METFORMIN HCL 1000 MG TABS: 1000 | 90 days supply | Qty: 180 | Fill #1

## 2020-02-18 ENCOUNTER — Other Ambulatory Visit (HOSPITAL_COMMUNITY): Payer: Self-pay | Admitting: Physician Assistant

## 2020-02-18 MED FILL — DICLOFENAC SOD EC 50 MG TAB: 50 | 15 days supply | Qty: 30 | Fill #0

## 2020-02-23 ENCOUNTER — Other Ambulatory Visit (HOSPITAL_COMMUNITY): Payer: Self-pay | Admitting: Dentist

## 2020-02-23 MED FILL — CLINDAMYCIN HCL 150 MG CAPS: 150 | 7 days supply | Qty: 28 | Fill #0

## 2020-03-03 MED FILL — PREMPRO 0.625-2.5 MG TABLET: 0.625-2.5 | 28 days supply | Qty: 28 | Fill #3

## 2020-04-05 ENCOUNTER — Other Ambulatory Visit (HOSPITAL_COMMUNITY): Payer: Self-pay | Admitting: Family Medicine

## 2020-04-05 MED FILL — FARXIGA 10 MG TABLET: 10 | 90 days supply | Qty: 90 | Fill #0

## 2020-04-05 MED FILL — PREMPRO 0.625-2.5 MG TABLET: 0.625-2.5 | 28 days supply | Qty: 28 | Fill #4

## 2020-04-22 MED FILL — TRIAMTERENE/HCTZ 37.5/25 CP: 37.5-25 | 90 days supply | Qty: 90 | Fill #2

## 2020-04-22 MED FILL — GLIMEPIRIDE 2 MG TABLET: 2 | 90 days supply | Qty: 90 | Fill #2

## 2020-04-22 MED FILL — ROSUVASTATIN CALCIUM 20 MG: 20 | 90 days supply | Qty: 90 | Fill #2

## 2020-05-03 MED FILL — PREMPRO 0.625-2.5 MG TABLET: 0.625-2.5 | 28 days supply | Qty: 28 | Fill #5

## 2020-05-20 ENCOUNTER — Encounter (HOSPITAL_COMMUNITY): Payer: Self-pay | Admitting: Emergency Medicine

## 2020-05-20 ENCOUNTER — Ambulatory Visit (HOSPITAL_COMMUNITY)
Admission: EM | Admit: 2020-05-20 | Discharge: 2020-05-20 | Disposition: A | Discharge status: Home / Self Care | Payer: BC Managed Care – PPO | Attending: Internal Medicine | Admitting: Internal Medicine

## 2020-05-20 ENCOUNTER — Other Ambulatory Visit: Payer: Self-pay

## 2020-05-20 ENCOUNTER — Other Ambulatory Visit (HOSPITAL_COMMUNITY): Payer: Self-pay | Admitting: Internal Medicine

## 2020-05-20 DIAGNOSIS — S8391XA Sprain of unspecified site of right knee, initial encounter: Secondary | ICD-10-CM | POA: Diagnosis not present

## 2020-05-20 DIAGNOSIS — S8392XA Sprain of unspecified site of left knee, initial encounter: Secondary | ICD-10-CM

## 2020-05-20 MED ORDER — IBUPROFEN 600 MG PO TABS: 600.0000 mg | ORAL_TABLET | Freq: Four times a day (QID) | ORAL | 0 refills | Status: DC | PRN

## 2020-05-20 MED FILL — IBUPROFEN 600 MG TABLET: 600 | 8 days supply | Qty: 30 | Fill #0

## 2020-05-20 NOTE — ED Provider Notes (Signed)
[place]    CSN: [id number] Arrival date & time: 05/20/20  0806      History   Chief Complaint Chief Complaint  Patient presents with  . Fall  . Knee Pain  . Back Pain    HPI Susan Preston is a 50 y.o. female comes to the urgent care with complaints of bilateral knee pain.  Patient had a mechanical fall and landed on both knees.  She is able to bear weight.  Patient describes pain as sharp, mild to moderate severity and associated with abrasion over the right knee.  No swelling of the right knee.   HPI  Past Medical History:  Diagnosis Date  . Diabetes mellitus   . Hypertension     There are no problems to display for this patient.   Past Surgical History:  Procedure Laterality Date  . WRIST SURGERY      OB History   No obstetric history on file.      Home Medications    Prior to Admission medications   Medication Sig Start Date End Date Taking? Authorizing Provider  aspirin 81 MG chewable tablet 1 tablet 07/05/12  Yes [provider]  ibuprofen (ADVIL) 600 MG tablet Take 1 tablet (600 mg total) by mouth every 6 (six) hours as needed. 05/20/20  Yes Breckyn Troyer, Myrene Galas, MD  OneTouch Delica Lancets 02H MISC Use 1 lancet as directed 04/21/19  Yes [provider]  dapagliflozin propanediol (FARXIGA) 10 MG TABS tablet Take 1 tablet by mouth daily.    [provider]  docusate sodium (COLACE) 100 MG capsule 1 capsule as needed    [provider]  glimepiride (AMARYL) 2 MG tablet TAKE 1 TABLET BY MOUTH ONCE A DAY WITH BREAKFAST OR THE FIRST MAIN MEAL OF THE DAY.    [provider]  metFORMIN (GLUCOPHAGE) 1000 MG tablet TAKE 1 TABLET BY MOUTH TWICE DAILY WITH A MEAL    [provider]  Multiple Vitamins-Minerals (MULTI FOR HER) TABS See admin instructions.    [provider]  [facility] VERIO test strip 1 each daily. 12/02/19   [provider]  PREMPRO 0.625-2.5 MG tablet Take 1 tablet by  mouth daily. 05/03/20   [provider]  rosuvastatin (CRESTOR) 20 MG tablet Take 1 tablet by mouth daily.    [provider]  triamterene-hydrochlorothiazide (DYAZIDE) 37.5-25 MG capsule Take 1 capsule by mouth every morning. 04/22/20   [provider]    Family History Family History  Problem Relation Age of Onset  . Breast cancer Neg Hx     Social History Social History   Tobacco Use  . Smoking status: Never Smoker  Substance Use Topics  . Alcohol use: Yes    Comment: occasionally  . Drug use: No     Allergies   Patient has no known allergies.   Review of Systems Review of Systems  Gastrointestinal: Negative.   Musculoskeletal: Negative.  Negative for arthralgias, joint swelling and myalgias.  Skin: Negative.      Physical Exam Triage Vital Signs ED Triage Vitals  Enc Vitals Group     BP 05/20/20 0836 134/86     Pulse Rate 05/20/20 0836 87     Resp 05/20/20 0836 18     Temp 05/20/20 0836 98.6 F (37 C)     Temp Source 05/20/20 0836 Oral     SpO2 05/20/20 0836 98 %     Weight --      Height --  Head Circumference --      Peak Flow --      Pain Score 05/20/20 0834 8     Pain Loc --      Pain Edu? --      Excl. in [place]? --    No data found.  Updated Vital Signs BP 134/86 (BP Location: Left Arm)   Pulse 87   Temp 98.6 F (37 C) (Oral)   Resp 18   SpO2 98%   Visual Acuity Right Eye Distance:   Left Eye Distance:   Bilateral Distance:    Right Eye Near:   Left Eye Near:    Bilateral Near:     Physical Exam Vitals and nursing note reviewed.  Cardiovascular:     Rate and Rhythm: Normal rate and regular rhythm.  Musculoskeletal:        General: No swelling or deformity. Normal range of motion.  Skin:    Findings: Bruising and lesion present.     Comments: Abrasion over the right knee.  Anterior and posterior drawer tests are negative in both knees.      UC Treatments / Results  Labs (all labs ordered are  listed, but only abnormal results are displayed) Labs Reviewed - No data to display  EKG   Radiology No results found.  Procedures Procedures (including critical care time)  Medications Ordered in UC Medications - No data to display  Initial Impression / Assessment and Plan / UC Course  I have reviewed the triage vital signs and the nursing notes.  Pertinent labs & imaging results that were available during my care of the patient were reviewed by me and considered in my medical decision making (see chart for details).     1.  Bilateral knee pain: Ibuprofen as needed for pain Gentle range of motion exercises Anticipate that the symptoms will resolve Return precautions given. Final Clinical Impressions(s) / UC Diagnoses   Final diagnoses:  Knee sprain, bilateral     Discharge Instructions     Please take medications as directed Gentle range of motion exercises Icing as needed Return to urgent care if you have any swelling, worsening pain, worsening redness around the area of abrasion.   ED Prescriptions    Medication Sig Dispense Auth. Provider   ibuprofen (ADVIL) 600 MG tablet Take 1 tablet (600 mg total) by mouth every 6 (six) hours as needed. 30 tablet Kalib Bhagat, Myrene Galas, MD     PDMP not reviewed this encounter.   Chase Picket, MD 05/20/20 [phone]

## 2020-05-20 NOTE — ED Triage Notes (Signed)
Pt presents with knee and back pain after fall on Tuesday at work. States fell on both knees.

## 2020-05-20 NOTE — Discharge Instructions (Addendum)
Please take medications as directed Gentle range of motion exercises Icing as needed Return to urgent care if you have any swelling, worsening pain, worsening redness around the area of abrasion.

## 2020-05-25 ENCOUNTER — Other Ambulatory Visit: Payer: Self-pay | Admitting: Family Medicine

## 2020-05-25 DIAGNOSIS — Z1231 Encounter for screening mammogram for malignant neoplasm of breast: Secondary | ICD-10-CM

## 2020-05-28 MED FILL — PREMPRO 0.625-2.5 MG TABLET: 0.625-2.5 | 28 days supply | Qty: 28 | Fill #6

## 2020-06-21 ENCOUNTER — Other Ambulatory Visit (HOSPITAL_COMMUNITY): Payer: Self-pay | Admitting: Family Medicine

## 2020-06-28 ENCOUNTER — Other Ambulatory Visit (HOSPITAL_COMMUNITY): Payer: Self-pay | Admitting: Family Medicine

## 2020-06-28 MED FILL — FARXIGA 10 MG TABLET: 10 | 90 days supply | Qty: 90 | Fill #0

## 2020-06-28 MED FILL — METFORMIN HCL 1000 MG TABS: 1000 | 90 days supply | Qty: 180 | Fill #2

## 2020-06-28 MED FILL — PREMPRO 0.625-2.5 MG TABLET: 0.625-2.5 | 28 days supply | Qty: 28 | Fill #0

## 2020-07-14 ENCOUNTER — Other Ambulatory Visit: Payer: Self-pay

## 2020-07-14 ENCOUNTER — Ambulatory Visit
Admission: RE | Admit: 2020-07-14 | Discharge: 2020-07-14 | Disposition: A | Discharge status: Home / Self Care | Payer: BC Managed Care – PPO | Source: Ambulatory Visit | Attending: Family Medicine | Admitting: Family Medicine

## 2020-07-14 DIAGNOSIS — Z1231 Encounter for screening mammogram for malignant neoplasm of breast: Secondary | ICD-10-CM

## 2020-07-23 ENCOUNTER — Other Ambulatory Visit (HOSPITAL_COMMUNITY): Payer: Self-pay

## 2020-07-23 MED FILL — Conjugated Estrogen-Medroxyprogest Acetate Tab 0.625-2.5 MG: ORAL | 28 days supply | Qty: 28 | Fill #0 | Status: AC

## 2020-07-26 ENCOUNTER — Other Ambulatory Visit (HOSPITAL_COMMUNITY): Payer: Self-pay

## 2020-07-27 ENCOUNTER — Other Ambulatory Visit (HOSPITAL_COMMUNITY): Payer: Self-pay

## 2020-07-27 MED ORDER — GLIMEPIRIDE 2 MG PO TABS
2.0000 mg | ORAL_TABLET | Freq: Every day | ORAL | 2 refills | Status: DC
  Filled 2020-07-27: qty 90, 90d supply, fill #0
  Filled 2020-10-21: qty 90, 90d supply, fill #1
  Filled 2021-01-24: qty 90, 90d supply, fill #2

## 2020-07-28 ENCOUNTER — Other Ambulatory Visit (HOSPITAL_COMMUNITY): Payer: Self-pay

## 2020-07-28 MED ORDER — ONETOUCH VERIO VI STRP
ORAL_STRIP | 3 refills | Status: DC
  Filled 2020-07-28: qty 100, 90d supply, fill #0
  Filled 2021-02-07: qty 100, 90d supply, fill #1

## 2020-07-28 MED ORDER — ROSUVASTATIN CALCIUM 20 MG PO TABS
20.0000 mg | ORAL_TABLET | Freq: Every day | ORAL | 2 refills | Status: DC
  Filled 2020-07-28: qty 90, 90d supply, fill #0
  Filled 2020-10-21: qty 90, 90d supply, fill #1
  Filled 2021-01-24: qty 90, 90d supply, fill #2

## 2020-07-28 MED ORDER — TRIAMTERENE-HCTZ 37.5-25 MG PO CAPS
1.0000 | ORAL_CAPSULE | Freq: Every morning | ORAL | 2 refills | Status: DC
  Filled 2020-07-28: qty 90, 90d supply, fill #0
  Filled 2020-10-21: qty 90, 90d supply, fill #1
  Filled 2021-01-24: qty 90, 90d supply, fill #2

## 2020-07-28 MED ORDER — ONETOUCH DELICA PLUS LANCET30G MISC
3 refills | Status: AC
  Filled 2020-07-28: qty 100, 90d supply, fill #0

## 2020-07-28 MED ORDER — PREMPRO 0.625-2.5 MG PO TABS
1.0000 | ORAL_TABLET | Freq: Every day | ORAL | 6 refills | Status: DC
  Filled 2020-07-28 – 2021-02-07 (×2): qty 28, 28d supply, fill #0
  Filled 2021-04-06: qty 28, 28d supply, fill #1
  Filled 2021-05-13: qty 28, 28d supply, fill #2
  Filled 2021-06-19: qty 28, 28d supply, fill #3
  Filled 2021-07-22: qty 28, 28d supply, fill #4

## 2020-08-25 ENCOUNTER — Other Ambulatory Visit (HOSPITAL_COMMUNITY): Payer: Self-pay

## 2020-08-25 MED ORDER — ESTRADIOL 0.1 MG/GM VA CREA
TOPICAL_CREAM | VAGINAL | 5 refills | Status: AC
Start: 2020-08-25 — End: ?
  Filled 2020-08-25: qty 42.5, 90d supply, fill #0
  Filled 2021-03-03: qty 42.5, 90d supply, fill #1
  Filled 2021-05-16: qty 42.5, 90d supply, fill #2

## 2020-08-26 ENCOUNTER — Other Ambulatory Visit (HOSPITAL_COMMUNITY): Payer: Self-pay

## 2020-08-26 MED FILL — Conjugated Estrogen-Medroxyprogest Acetate Tab 0.625-2.5 MG: ORAL | 28 days supply | Qty: 28 | Fill #1 | Status: AC

## 2020-09-26 MED FILL — Conjugated Estrogen-Medroxyprogest Acetate Tab 0.625-2.5 MG: ORAL | 28 days supply | Qty: 28 | Fill #2 | Status: AC

## 2020-09-27 ENCOUNTER — Other Ambulatory Visit (HOSPITAL_COMMUNITY): Payer: Self-pay

## 2020-10-03 MED FILL — Dapagliflozin Propanediol Tab 10 MG (Base Equivalent): ORAL | 90 days supply | Qty: 90 | Fill #0 | Status: AC

## 2020-10-04 ENCOUNTER — Other Ambulatory Visit (HOSPITAL_COMMUNITY): Payer: Self-pay

## 2020-10-21 ENCOUNTER — Other Ambulatory Visit (HOSPITAL_COMMUNITY): Payer: Self-pay

## 2020-10-21 MED ORDER — METFORMIN HCL 1000 MG PO TABS
1000.0000 mg | ORAL_TABLET | Freq: Two times a day (BID) | ORAL | 1 refills | Status: DC
  Filled 2020-10-21: qty 180, 90d supply, fill #0
  Filled 2021-03-03: qty 180, 90d supply, fill #1

## 2020-11-05 ENCOUNTER — Other Ambulatory Visit (HOSPITAL_COMMUNITY): Payer: Self-pay

## 2020-11-05 MED FILL — Conjugated Estrogen-Medroxyprogest Acetate Tab 0.625-2.5 MG: ORAL | 28 days supply | Qty: 28 | Fill #3 | Status: AC

## 2020-12-06 ENCOUNTER — Other Ambulatory Visit (HOSPITAL_COMMUNITY): Payer: Self-pay

## 2020-12-06 MED FILL — Conjugated Estrogen-Medroxyprogest Acetate Tab 0.625-2.5 MG: ORAL | 28 days supply | Qty: 28 | Fill #4 | Status: AC

## 2021-01-07 ENCOUNTER — Other Ambulatory Visit (HOSPITAL_COMMUNITY): Payer: Self-pay

## 2021-01-07 MED FILL — Conjugated Estrogen-Medroxyprogest Acetate Tab 0.625-2.5 MG: ORAL | 28 days supply | Qty: 28 | Fill #5 | Status: AC

## 2021-01-07 MED FILL — Dapagliflozin Propanediol Tab 10 MG (Base Equivalent): ORAL | 90 days supply | Qty: 90 | Fill #1 | Status: AC

## 2021-01-24 ENCOUNTER — Other Ambulatory Visit (HOSPITAL_COMMUNITY): Payer: Self-pay

## 2021-02-07 ENCOUNTER — Other Ambulatory Visit (HOSPITAL_COMMUNITY): Payer: Self-pay

## 2021-03-04 ENCOUNTER — Other Ambulatory Visit (HOSPITAL_COMMUNITY): Payer: Self-pay

## 2021-03-07 ENCOUNTER — Other Ambulatory Visit (HOSPITAL_COMMUNITY): Payer: Self-pay

## 2021-03-07 MED ORDER — PREMPRO 0.625-2.5 MG PO TABS
1.0000 | ORAL_TABLET | Freq: Every day | ORAL | 6 refills | Status: AC
  Filled 2021-03-07: qty 28, 28d supply, fill #0

## 2021-04-06 ENCOUNTER — Other Ambulatory Visit (HOSPITAL_COMMUNITY): Payer: Self-pay

## 2021-04-06 MED FILL — Dapagliflozin Propanediol Tab 10 MG (Base Equivalent): ORAL | 90 days supply | Qty: 90 | Fill #2 | Status: AC

## 2021-04-28 ENCOUNTER — Other Ambulatory Visit (HOSPITAL_COMMUNITY): Payer: Self-pay

## 2021-04-29 ENCOUNTER — Other Ambulatory Visit (HOSPITAL_COMMUNITY): Payer: Self-pay

## 2021-04-29 MED ORDER — TRIAMTERENE-HCTZ 37.5-25 MG PO CAPS
1.0000 | ORAL_CAPSULE | Freq: Every morning | ORAL | 1 refills | Status: AC
  Filled 2021-04-29: qty 80, 80d supply, fill #0
  Filled 2021-04-29: qty 10, 10d supply, fill #0
  Filled 2021-07-10: qty 90, 90d supply, fill #1

## 2021-04-29 MED ORDER — ROSUVASTATIN CALCIUM 20 MG PO TABS
20.0000 mg | ORAL_TABLET | Freq: Every day | ORAL | 1 refills | Status: AC
  Filled 2021-04-29: qty 90, 90d supply, fill #0
  Filled 2021-08-03: qty 90, 90d supply, fill #1

## 2021-04-29 MED ORDER — GLIMEPIRIDE 2 MG PO TABS
2.0000 mg | ORAL_TABLET | Freq: Every day | ORAL | 1 refills | Status: DC
  Filled 2021-04-29: qty 90, 90d supply, fill #0
  Filled 2021-07-10: qty 90, 90d supply, fill #1

## 2021-05-02 ENCOUNTER — Other Ambulatory Visit (HOSPITAL_COMMUNITY): Payer: Self-pay

## 2021-05-13 ENCOUNTER — Other Ambulatory Visit (HOSPITAL_COMMUNITY): Payer: Self-pay

## 2021-05-16 ENCOUNTER — Other Ambulatory Visit (HOSPITAL_COMMUNITY): Payer: Self-pay

## 2021-05-17 ENCOUNTER — Other Ambulatory Visit (HOSPITAL_COMMUNITY): Payer: Self-pay

## 2021-06-20 ENCOUNTER — Other Ambulatory Visit (HOSPITAL_COMMUNITY): Payer: Self-pay

## 2021-06-24 ENCOUNTER — Other Ambulatory Visit: Payer: Self-pay | Admitting: Family Medicine

## 2021-06-24 ENCOUNTER — Ambulatory Visit
Admission: RE | Admit: 2021-06-24 | Discharge: 2021-06-24 | Disposition: A | Discharge status: Home / Self Care | Payer: BC Managed Care – PPO | Source: Ambulatory Visit | Attending: Family Medicine | Admitting: Family Medicine

## 2021-06-24 ENCOUNTER — Other Ambulatory Visit: Payer: Self-pay

## 2021-06-24 DIAGNOSIS — D259 Leiomyoma of uterus, unspecified: Secondary | ICD-10-CM

## 2021-06-27 ENCOUNTER — Other Ambulatory Visit (HOSPITAL_COMMUNITY): Payer: Self-pay

## 2021-06-27 ENCOUNTER — Other Ambulatory Visit: Payer: Self-pay | Admitting: Family Medicine

## 2021-06-27 DIAGNOSIS — Z1231 Encounter for screening mammogram for malignant neoplasm of breast: Secondary | ICD-10-CM

## 2021-06-27 MED ORDER — GLIMEPIRIDE 4 MG PO TABS
4.0000 mg | ORAL_TABLET | Freq: Every day | ORAL | 1 refills | Status: AC
  Filled 2021-06-27 – 2021-08-30 (×2): qty 90, 90d supply, fill #0
  Filled 2021-12-09: qty 90, 90d supply, fill #1

## 2021-07-05 ENCOUNTER — Other Ambulatory Visit (HOSPITAL_COMMUNITY): Payer: Self-pay

## 2021-07-10 ENCOUNTER — Other Ambulatory Visit (HOSPITAL_COMMUNITY): Payer: Self-pay

## 2021-07-11 ENCOUNTER — Other Ambulatory Visit (HOSPITAL_COMMUNITY): Payer: Self-pay

## 2021-07-11 MED ORDER — METFORMIN HCL 1000 MG PO TABS
1000.0000 mg | ORAL_TABLET | Freq: Two times a day (BID) | ORAL | 1 refills | Status: DC
  Filled 2021-07-11: qty 180, 90d supply, fill #0
  Filled 2021-11-06: qty 180, 90d supply, fill #1

## 2021-07-12 ENCOUNTER — Other Ambulatory Visit (HOSPITAL_COMMUNITY): Payer: Self-pay

## 2021-07-13 ENCOUNTER — Other Ambulatory Visit (HOSPITAL_COMMUNITY): Payer: Self-pay

## 2021-07-13 MED ORDER — FARXIGA 10 MG PO TABS
10.0000 mg | ORAL_TABLET | Freq: Every day | ORAL | 3 refills | Status: DC
  Filled 2021-07-13: qty 90, 90d supply, fill #0
  Filled 2021-10-19: qty 90, 90d supply, fill #1
  Filled 2022-01-22: qty 90, 90d supply, fill #2
  Filled 2022-04-16: qty 90, 90d supply, fill #3

## 2021-07-20 ENCOUNTER — Ambulatory Visit
Admission: RE | Admit: 2021-07-20 | Discharge: 2021-07-20 | Disposition: A | Discharge status: Home / Self Care | Payer: BC Managed Care – PPO | Source: Ambulatory Visit | Attending: Family Medicine | Admitting: Family Medicine

## 2021-07-20 DIAGNOSIS — Z1231 Encounter for screening mammogram for malignant neoplasm of breast: Secondary | ICD-10-CM

## 2021-07-22 ENCOUNTER — Other Ambulatory Visit (HOSPITAL_COMMUNITY): Payer: Self-pay

## 2021-08-03 ENCOUNTER — Other Ambulatory Visit (HOSPITAL_COMMUNITY): Payer: Self-pay

## 2021-08-22 ENCOUNTER — Other Ambulatory Visit (HOSPITAL_COMMUNITY): Payer: Self-pay

## 2021-08-22 MED ORDER — PREMPRO 0.625-2.5 MG PO TABS
1.0000 | ORAL_TABLET | Freq: Every day | ORAL | 5 refills | Status: DC
  Filled 2021-08-22: qty 28, 28d supply, fill #0
  Filled 2021-10-03: qty 28, 28d supply, fill #1
  Filled 2021-11-06: qty 28, 28d supply, fill #2
  Filled 2021-12-09: qty 28, 28d supply, fill #3
  Filled 2022-01-17: qty 28, 28d supply, fill #4
  Filled 2022-02-20: qty 28, 28d supply, fill #5

## 2021-08-22 MED ORDER — GLIMEPIRIDE 2 MG PO TABS
2.0000 mg | ORAL_TABLET | Freq: Every day | ORAL | 1 refills | Status: AC
  Filled 2021-08-22: qty 90, 90d supply, fill #0

## 2021-08-30 ENCOUNTER — Other Ambulatory Visit (HOSPITAL_COMMUNITY): Payer: Self-pay

## 2021-09-07 ENCOUNTER — Other Ambulatory Visit (HOSPITAL_COMMUNITY): Payer: Self-pay

## 2021-09-07 MED ORDER — ONETOUCH VERIO VI STRP
1.0000 | ORAL_STRIP | Freq: Every day | 1 refills | Status: DC
Start: 2021-09-07 — End: 2022-12-04
  Filled 2021-09-07: qty 50, 50d supply, fill #0
  Filled 2021-12-09: qty 50, 50d supply, fill #1
  Filled 2022-03-02: qty 50, 50d supply, fill #2
  Filled 2022-06-23 – 2022-06-26 (×2): qty 50, 50d supply, fill #3

## 2021-10-03 ENCOUNTER — Other Ambulatory Visit (HOSPITAL_COMMUNITY): Payer: Self-pay

## 2021-10-05 ENCOUNTER — Other Ambulatory Visit (HOSPITAL_COMMUNITY): Payer: Self-pay

## 2021-10-05 MED ORDER — MELOXICAM 7.5 MG PO TABS
7.5000 mg | ORAL_TABLET | Freq: Every day | ORAL | 0 refills | Status: AC
  Filled 2021-10-05: qty 10, 10d supply, fill #0

## 2021-10-06 ENCOUNTER — Other Ambulatory Visit (HOSPITAL_COMMUNITY): Payer: Self-pay

## 2021-10-20 ENCOUNTER — Other Ambulatory Visit (HOSPITAL_COMMUNITY): Payer: Self-pay

## 2021-11-06 ENCOUNTER — Other Ambulatory Visit (HOSPITAL_COMMUNITY): Payer: Self-pay

## 2021-11-07 ENCOUNTER — Other Ambulatory Visit (HOSPITAL_COMMUNITY): Payer: Self-pay

## 2021-11-07 MED ORDER — ROSUVASTATIN CALCIUM 20 MG PO TABS
20.0000 mg | ORAL_TABLET | Freq: Every day | ORAL | 1 refills | Status: DC
  Filled 2021-11-07: qty 90, 90d supply, fill #0
  Filled 2022-02-06: qty 90, 90d supply, fill #1

## 2021-11-07 MED ORDER — TRIAMTERENE-HCTZ 37.5-25 MG PO CAPS
1.0000 | ORAL_CAPSULE | Freq: Every morning | ORAL | 1 refills | Status: DC
  Filled 2021-11-07: qty 90, 90d supply, fill #0
  Filled 2022-02-06: qty 90, 90d supply, fill #1

## 2021-11-09 ENCOUNTER — Other Ambulatory Visit (HOSPITAL_COMMUNITY): Payer: Self-pay

## 2021-12-09 ENCOUNTER — Other Ambulatory Visit (HOSPITAL_COMMUNITY): Payer: Self-pay

## 2022-01-13 ENCOUNTER — Other Ambulatory Visit (HOSPITAL_COMMUNITY): Payer: Self-pay

## 2022-01-13 MED ORDER — PREMPRO 0.625-2.5 MG PO TABS
1.0000 | ORAL_TABLET | Freq: Every day | ORAL | 1 refills | Status: AC
  Filled 2022-01-13: qty 84, 84d supply, fill #0

## 2022-01-17 ENCOUNTER — Other Ambulatory Visit (HOSPITAL_COMMUNITY): Payer: Self-pay

## 2022-01-23 ENCOUNTER — Other Ambulatory Visit (HOSPITAL_COMMUNITY): Payer: Self-pay

## 2022-02-07 ENCOUNTER — Other Ambulatory Visit (HOSPITAL_COMMUNITY): Payer: Self-pay

## 2022-02-13 ENCOUNTER — Other Ambulatory Visit (HOSPITAL_COMMUNITY): Payer: Self-pay

## 2022-02-13 MED ORDER — RYBELSUS 7 MG PO TABS
1.0000 | ORAL_TABLET | Freq: Every day | ORAL | 1 refills | Status: DC
  Filled 2022-02-13: qty 90, 90d supply, fill #0
  Filled 2022-05-11: qty 90, 90d supply, fill #1

## 2022-02-14 ENCOUNTER — Other Ambulatory Visit (HOSPITAL_COMMUNITY): Payer: Self-pay

## 2022-02-20 ENCOUNTER — Other Ambulatory Visit (HOSPITAL_COMMUNITY): Payer: Self-pay

## 2022-03-02 ENCOUNTER — Other Ambulatory Visit (HOSPITAL_COMMUNITY): Payer: Self-pay

## 2022-03-03 ENCOUNTER — Other Ambulatory Visit (HOSPITAL_COMMUNITY): Payer: Self-pay

## 2022-03-03 MED ORDER — METFORMIN HCL 1000 MG PO TABS
1000.0000 mg | ORAL_TABLET | Freq: Two times a day (BID) | ORAL | 1 refills | Status: DC
  Filled 2022-03-03: qty 180, 90d supply, fill #0
  Filled 2022-07-23: qty 180, 90d supply, fill #1

## 2022-03-15 ENCOUNTER — Other Ambulatory Visit (HOSPITAL_COMMUNITY): Payer: Self-pay

## 2022-03-22 ENCOUNTER — Other Ambulatory Visit (HOSPITAL_COMMUNITY): Payer: Self-pay

## 2022-03-23 ENCOUNTER — Other Ambulatory Visit (HOSPITAL_COMMUNITY): Payer: Self-pay

## 2022-03-23 ENCOUNTER — Other Ambulatory Visit: Payer: Self-pay

## 2022-03-23 MED ORDER — PREMPRO 0.625-2.5 MG PO TABS
1.0000 | ORAL_TABLET | Freq: Every day | ORAL | 2 refills | Status: DC
  Filled 2022-03-23: qty 28, 28d supply, fill #0
  Filled 2022-04-16: qty 28, 28d supply, fill #1
  Filled 2022-05-22: qty 28, 28d supply, fill #2

## 2022-04-20 ENCOUNTER — Other Ambulatory Visit (HOSPITAL_COMMUNITY): Payer: Self-pay

## 2022-05-11 ENCOUNTER — Other Ambulatory Visit (HOSPITAL_COMMUNITY): Payer: Self-pay

## 2022-05-11 MED ORDER — TRIAMTERENE-HCTZ 37.5-25 MG PO CAPS
1.0000 | ORAL_CAPSULE | Freq: Every morning | ORAL | 0 refills | Status: DC
  Filled 2022-05-11: qty 90, 90d supply, fill #0

## 2022-05-11 MED ORDER — ROSUVASTATIN CALCIUM 20 MG PO TABS
20.0000 mg | ORAL_TABLET | Freq: Every day | ORAL | 0 refills | Status: DC
  Filled 2022-05-11: qty 90, 90d supply, fill #0

## 2022-05-24 ENCOUNTER — Other Ambulatory Visit (HOSPITAL_COMMUNITY): Payer: Self-pay

## 2022-05-24 MED ORDER — METRONIDAZOLE 500 MG PO TABS
500.0000 mg | ORAL_TABLET | Freq: Two times a day (BID) | ORAL | 0 refills | Status: DC
  Filled 2022-05-24: qty 14, 7d supply, fill #0

## 2022-06-19 ENCOUNTER — Other Ambulatory Visit: Payer: Self-pay | Admitting: Family Medicine

## 2022-06-19 DIAGNOSIS — Z0000 Encounter for general adult medical examination without abnormal findings: Secondary | ICD-10-CM

## 2022-06-23 ENCOUNTER — Other Ambulatory Visit (HOSPITAL_COMMUNITY): Payer: Self-pay

## 2022-06-23 MED ORDER — PREMPRO 0.625-2.5 MG PO TABS
1.0000 | ORAL_TABLET | Freq: Every day | ORAL | 2 refills | Status: AC
  Filled 2022-06-23: qty 28, 28d supply, fill #0
  Filled 2022-07-23: qty 28, 28d supply, fill #1
  Filled 2022-08-24: qty 28, 28d supply, fill #2

## 2022-06-26 ENCOUNTER — Other Ambulatory Visit (HOSPITAL_COMMUNITY): Payer: Self-pay

## 2022-06-26 ENCOUNTER — Other Ambulatory Visit: Payer: Self-pay

## 2022-06-28 ENCOUNTER — Other Ambulatory Visit (HOSPITAL_COMMUNITY): Payer: Self-pay

## 2022-06-28 MED ORDER — ESTRADIOL 0.1 MG/GM VA CREA
0.5000 g | TOPICAL_CREAM | VAGINAL | 5 refills | Status: AC
  Filled 2022-06-28: qty 42.5, 90d supply, fill #0

## 2022-07-23 ENCOUNTER — Other Ambulatory Visit (HOSPITAL_COMMUNITY): Payer: Self-pay

## 2022-07-24 ENCOUNTER — Other Ambulatory Visit (HOSPITAL_COMMUNITY): Payer: Self-pay

## 2022-07-24 MED ORDER — DAPAGLIFLOZIN PROPANEDIOL 10 MG PO TABS
10.0000 mg | ORAL_TABLET | Freq: Every day | ORAL | 1 refills | Status: DC
  Filled 2022-07-24: qty 90, 90d supply, fill #0
  Filled 2022-10-17: qty 90, 90d supply, fill #1

## 2022-07-24 MED ORDER — RYBELSUS 7 MG PO TABS
7.0000 mg | ORAL_TABLET | Freq: Every day | ORAL | 1 refills | Status: DC
  Filled 2022-07-24: qty 90, 90d supply, fill #0
  Filled 2022-10-13: qty 90, 90d supply, fill #1

## 2022-07-25 ENCOUNTER — Other Ambulatory Visit: Payer: Self-pay | Admitting: Family Medicine

## 2022-07-25 ENCOUNTER — Other Ambulatory Visit (HOSPITAL_COMMUNITY): Payer: Self-pay

## 2022-07-25 DIAGNOSIS — Z1231 Encounter for screening mammogram for malignant neoplasm of breast: Secondary | ICD-10-CM

## 2022-07-27 ENCOUNTER — Ambulatory Visit
Admission: RE | Admit: 2022-07-27 | Discharge: 2022-07-27 | Disposition: A | Discharge status: Home / Self Care | Payer: BC Managed Care – PPO | Source: Ambulatory Visit | Attending: Family Medicine | Admitting: Family Medicine

## 2022-07-27 DIAGNOSIS — Z1231 Encounter for screening mammogram for malignant neoplasm of breast: Secondary | ICD-10-CM

## 2022-08-12 ENCOUNTER — Other Ambulatory Visit (HOSPITAL_COMMUNITY): Payer: Self-pay

## 2022-08-14 ENCOUNTER — Other Ambulatory Visit (HOSPITAL_COMMUNITY): Payer: Self-pay

## 2022-08-14 MED ORDER — ROSUVASTATIN CALCIUM 20 MG PO TABS
20.0000 mg | ORAL_TABLET | Freq: Every day | ORAL | 1 refills | Status: DC
  Filled 2022-08-14: qty 90, 90d supply, fill #0
  Filled 2022-11-12: qty 90, 90d supply, fill #1

## 2022-08-14 MED ORDER — TRIAMTERENE-HCTZ 37.5-25 MG PO CAPS
1.0000 | ORAL_CAPSULE | Freq: Every morning | ORAL | 1 refills | Status: DC
  Filled 2022-08-14: qty 90, 90d supply, fill #0
  Filled 2022-11-12: qty 90, 90d supply, fill #1

## 2022-08-18 ENCOUNTER — Other Ambulatory Visit (HOSPITAL_COMMUNITY): Payer: Self-pay

## 2022-08-22 ENCOUNTER — Other Ambulatory Visit (HOSPITAL_COMMUNITY): Payer: Self-pay

## 2022-08-22 MED ORDER — METRONIDAZOLE 500 MG PO TABS
500.0000 mg | ORAL_TABLET | Freq: Two times a day (BID) | ORAL | 0 refills | Status: AC
  Filled 2022-08-22: qty 14, 7d supply, fill #0

## 2022-09-11 ENCOUNTER — Other Ambulatory Visit (HOSPITAL_COMMUNITY): Payer: Self-pay

## 2022-09-11 MED ORDER — POLYMYXIN B-TRIMETHOPRIM 10000-0.1 UNIT/ML-% OP SOLN
2.0000 [drp] | Freq: Three times a day (TID) | OPHTHALMIC | 0 refills | Status: AC
  Filled 2022-09-11: qty 10, 5d supply, fill #0

## 2022-10-17 ENCOUNTER — Other Ambulatory Visit (HOSPITAL_COMMUNITY): Payer: Self-pay

## 2022-11-30 ENCOUNTER — Other Ambulatory Visit (HOSPITAL_COMMUNITY): Payer: Self-pay

## 2022-12-04 ENCOUNTER — Other Ambulatory Visit (HOSPITAL_COMMUNITY): Payer: Self-pay

## 2022-12-04 MED ORDER — ONETOUCH VERIO VI STRP
ORAL_STRIP | Freq: Every day | 1 refills | Status: AC
  Filled 2022-12-04: qty 100, 90d supply, fill #0
  Filled 2022-12-25: qty 50, 50d supply, fill #0
  Filled 2023-05-29: qty 100, 90d supply, fill #1

## 2022-12-14 ENCOUNTER — Other Ambulatory Visit (HOSPITAL_COMMUNITY): Payer: Self-pay

## 2022-12-25 ENCOUNTER — Other Ambulatory Visit (HOSPITAL_COMMUNITY): Payer: Self-pay

## 2023-01-22 ENCOUNTER — Other Ambulatory Visit (HOSPITAL_COMMUNITY): Payer: Self-pay

## 2023-01-22 MED ORDER — DAPAGLIFLOZIN PROPANEDIOL 10 MG PO TABS
10.0000 mg | ORAL_TABLET | Freq: Every day | ORAL | 1 refills | Status: DC
  Filled 2023-01-22: qty 90, 90d supply, fill #0
  Filled 2023-04-28: qty 90, 90d supply, fill #1

## 2023-01-22 MED ORDER — METFORMIN HCL 1000 MG PO TABS
1000.0000 mg | ORAL_TABLET | Freq: Two times a day (BID) | ORAL | 1 refills | Status: AC
  Filled 2023-01-22: qty 180, 90d supply, fill #0
  Filled 2023-10-09 – 2023-10-22 (×2): qty 180, 90d supply, fill #1

## 2023-02-07 ENCOUNTER — Other Ambulatory Visit (HOSPITAL_COMMUNITY): Payer: Self-pay

## 2023-02-08 ENCOUNTER — Other Ambulatory Visit (HOSPITAL_COMMUNITY): Payer: Self-pay

## 2023-02-08 MED ORDER — RYBELSUS 7 MG PO TABS
7.0000 mg | ORAL_TABLET | Freq: Every day | ORAL | 0 refills | Status: DC
  Filled 2023-02-08: qty 30, 30d supply, fill #0
  Filled 2023-02-09: qty 90, 90d supply, fill #0

## 2023-02-09 ENCOUNTER — Other Ambulatory Visit (HOSPITAL_COMMUNITY): Payer: Self-pay

## 2023-02-09 MED ORDER — ROSUVASTATIN CALCIUM 20 MG PO TABS
20.0000 mg | ORAL_TABLET | Freq: Every day | ORAL | 0 refills | Status: DC
  Filled 2023-02-09: qty 90, 90d supply, fill #0

## 2023-02-09 MED ORDER — TRIAMTERENE-HCTZ 37.5-25 MG PO CAPS
1.0000 | ORAL_CAPSULE | Freq: Every morning | ORAL | 1 refills | Status: DC
  Filled 2023-02-09: qty 90, 90d supply, fill #0
  Filled 2023-05-10: qty 90, 90d supply, fill #1

## 2023-04-16 ENCOUNTER — Other Ambulatory Visit (HOSPITAL_COMMUNITY): Payer: Self-pay

## 2023-04-16 MED ORDER — PREDNISONE 20 MG PO TABS
ORAL_TABLET | ORAL | 0 refills | Status: AC
  Filled 2023-04-16: qty 12, 6d supply, fill #0

## 2023-04-28 ENCOUNTER — Other Ambulatory Visit (HOSPITAL_COMMUNITY): Payer: Self-pay

## 2023-04-29 ENCOUNTER — Other Ambulatory Visit: Payer: Self-pay

## 2023-05-01 ENCOUNTER — Other Ambulatory Visit: Payer: Self-pay

## 2023-05-01 ENCOUNTER — Other Ambulatory Visit (HOSPITAL_COMMUNITY): Payer: Self-pay

## 2023-05-01 MED ORDER — RYBELSUS 7 MG PO TABS
7.0000 mg | ORAL_TABLET | Freq: Every day | ORAL | 1 refills | Status: DC
  Filled 2023-05-01: qty 90, 90d supply, fill #0
  Filled 2023-07-30: qty 90, 90d supply, fill #1
  Filled 2023-11-04: qty 90, 90d supply, fill #2

## 2023-05-02 ENCOUNTER — Other Ambulatory Visit (HOSPITAL_COMMUNITY): Payer: Self-pay

## 2023-05-04 ENCOUNTER — Other Ambulatory Visit (HOSPITAL_COMMUNITY): Payer: Self-pay

## 2023-05-04 MED ORDER — HYDROXYZINE HCL 25 MG PO TABS
25.0000 mg | ORAL_TABLET | Freq: Every day | ORAL | 0 refills | Status: AC | PRN
  Filled 2023-05-04: qty 30, 30d supply, fill #0

## 2023-05-04 MED ORDER — TRIAMCINOLONE ACETONIDE 0.1 % EX OINT
1.0000 | TOPICAL_OINTMENT | Freq: Two times a day (BID) | CUTANEOUS | 0 refills | Status: AC
  Filled 2023-05-04: qty 30, 15d supply, fill #0

## 2023-05-10 ENCOUNTER — Other Ambulatory Visit (HOSPITAL_COMMUNITY): Payer: Self-pay

## 2023-05-10 MED ORDER — ROSUVASTATIN CALCIUM 20 MG PO TABS
20.0000 mg | ORAL_TABLET | Freq: Every day | ORAL | 1 refills | Status: DC
  Filled 2023-05-10: qty 90, 90d supply, fill #0
  Filled 2023-08-16: qty 90, 90d supply, fill #1
  Filled 2023-11-19: qty 20, 20d supply, fill #2
  Filled 2023-11-19: qty 90, 90d supply, fill #2

## 2023-05-14 ENCOUNTER — Other Ambulatory Visit (HOSPITAL_COMMUNITY): Payer: Self-pay

## 2023-05-14 MED ORDER — TRIAMCINOLONE ACETONIDE 0.1 % EX CREA
1.0000 | TOPICAL_CREAM | Freq: Two times a day (BID) | CUTANEOUS | 3 refills | Status: AC
Start: 2023-05-14 — End: ?
  Filled 2023-05-14: qty 454, 21d supply, fill #0

## 2023-05-29 ENCOUNTER — Other Ambulatory Visit (HOSPITAL_COMMUNITY): Payer: Self-pay

## 2023-07-09 ENCOUNTER — Other Ambulatory Visit: Payer: Self-pay | Admitting: Family Medicine

## 2023-07-09 DIAGNOSIS — Z1231 Encounter for screening mammogram for malignant neoplasm of breast: Secondary | ICD-10-CM

## 2023-07-23 ENCOUNTER — Other Ambulatory Visit (HOSPITAL_COMMUNITY): Payer: Self-pay

## 2023-07-30 ENCOUNTER — Other Ambulatory Visit (HOSPITAL_COMMUNITY): Payer: Self-pay

## 2023-07-30 ENCOUNTER — Ambulatory Visit
Admission: RE | Admit: 2023-07-30 | Discharge: 2023-07-30 | Disposition: A | Discharge status: Home / Self Care | Payer: Self-pay | Source: Ambulatory Visit | Attending: Family Medicine | Admitting: Family Medicine

## 2023-07-30 DIAGNOSIS — Z1231 Encounter for screening mammogram for malignant neoplasm of breast: Secondary | ICD-10-CM

## 2023-07-30 MED ORDER — DAPAGLIFLOZIN PROPANEDIOL 10 MG PO TABS
10.0000 mg | ORAL_TABLET | Freq: Every day | ORAL | 1 refills | Status: DC
  Filled 2023-07-30: qty 90, 90d supply, fill #0
  Filled 2023-11-06: qty 90, 90d supply, fill #1
  Filled 2024-01-28: qty 90, 90d supply, fill #2

## 2023-08-16 ENCOUNTER — Other Ambulatory Visit (HOSPITAL_COMMUNITY): Payer: Self-pay

## 2023-08-16 ENCOUNTER — Other Ambulatory Visit: Payer: Self-pay

## 2023-08-16 MED ORDER — TRIAMTERENE-HCTZ 37.5-25 MG PO CAPS
1.0000 | ORAL_CAPSULE | Freq: Every morning | ORAL | 1 refills | Status: AC
Start: 2023-08-16 — End: ?
  Filled 2023-08-16: qty 90, 90d supply, fill #0

## 2023-10-18 ENCOUNTER — Other Ambulatory Visit (HOSPITAL_COMMUNITY): Payer: Self-pay

## 2023-10-22 ENCOUNTER — Other Ambulatory Visit (HOSPITAL_COMMUNITY): Payer: Self-pay

## 2023-11-05 ENCOUNTER — Other Ambulatory Visit (HOSPITAL_COMMUNITY): Payer: Self-pay

## 2023-11-05 MED ORDER — RYBELSUS 7 MG PO TABS
7.0000 mg | ORAL_TABLET | Freq: Every day | ORAL | 1 refills | Status: DC
  Filled 2023-11-05: qty 90, 90d supply, fill #0
  Filled 2024-01-28: qty 90, 90d supply, fill #1
  Filled 2024-05-01: qty 90, 90d supply, fill #2

## 2023-11-06 ENCOUNTER — Other Ambulatory Visit (HOSPITAL_COMMUNITY): Payer: Self-pay

## 2023-11-08 ENCOUNTER — Other Ambulatory Visit (HOSPITAL_COMMUNITY): Payer: Self-pay

## 2023-11-09 ENCOUNTER — Other Ambulatory Visit (HOSPITAL_COMMUNITY): Payer: Self-pay

## 2023-11-12 ENCOUNTER — Other Ambulatory Visit (HOSPITAL_COMMUNITY): Payer: Self-pay

## 2023-11-12 MED ORDER — HYDROCHLOROTHIAZIDE 12.5 MG PO TABS
12.5000 mg | ORAL_TABLET | Freq: Every morning | ORAL | 1 refills | Status: DC
  Filled 2023-11-12: qty 30, 30d supply, fill #0
  Filled 2023-12-10: qty 30, 30d supply, fill #1

## 2023-11-19 ENCOUNTER — Other Ambulatory Visit (HOSPITAL_COMMUNITY): Payer: Self-pay

## 2023-12-10 ENCOUNTER — Other Ambulatory Visit (HOSPITAL_COMMUNITY): Payer: Self-pay

## 2023-12-11 ENCOUNTER — Other Ambulatory Visit (HOSPITAL_COMMUNITY): Payer: Self-pay

## 2023-12-11 MED ORDER — ROSUVASTATIN CALCIUM 20 MG PO TABS
20.0000 mg | ORAL_TABLET | Freq: Every day | ORAL | 1 refills | Status: AC
  Filled 2023-12-11: qty 90, 90d supply, fill #0
  Filled 2024-03-09: qty 90, 90d supply, fill #1

## 2024-01-09 ENCOUNTER — Other Ambulatory Visit (HOSPITAL_COMMUNITY): Payer: Self-pay

## 2024-01-10 ENCOUNTER — Other Ambulatory Visit (HOSPITAL_COMMUNITY): Payer: Self-pay

## 2024-01-10 MED ORDER — HYDROCHLOROTHIAZIDE 12.5 MG PO TABS
12.5000 mg | ORAL_TABLET | Freq: Every morning | ORAL | 3 refills | Status: AC
  Filled 2024-01-10: qty 90, 90d supply, fill #0
  Filled 2024-04-11: qty 90, 90d supply, fill #1

## 2024-01-14 ENCOUNTER — Other Ambulatory Visit (HOSPITAL_COMMUNITY): Payer: Self-pay

## 2024-01-29 ENCOUNTER — Other Ambulatory Visit (HOSPITAL_COMMUNITY): Payer: Self-pay

## 2024-01-29 ENCOUNTER — Other Ambulatory Visit: Payer: Self-pay

## 2024-01-29 MED ORDER — DAPAGLIFLOZIN PROPANEDIOL 10 MG PO TABS
10.0000 mg | ORAL_TABLET | Freq: Every day | ORAL | 1 refills | Status: AC
  Filled 2024-01-29: qty 90, 90d supply, fill #0
  Filled 2024-05-01: qty 90, 90d supply, fill #1

## 2024-02-04 ENCOUNTER — Other Ambulatory Visit (HOSPITAL_COMMUNITY): Payer: Self-pay

## 2024-03-11 ENCOUNTER — Other Ambulatory Visit (HOSPITAL_COMMUNITY): Payer: Self-pay

## 2024-03-11 MED ORDER — INDOMETHACIN 50 MG PO CAPS
50.0000 mg | ORAL_CAPSULE | Freq: Three times a day (TID) | ORAL | 1 refills | Status: AC
  Filled 2024-03-11: qty 90, 30d supply, fill #0

## 2024-05-01 ENCOUNTER — Other Ambulatory Visit: Payer: Self-pay

## 2024-05-01 ENCOUNTER — Other Ambulatory Visit (HOSPITAL_COMMUNITY): Payer: Self-pay

## 2024-05-01 MED ORDER — RYBELSUS 7 MG PO TABS
7.0000 mg | ORAL_TABLET | Freq: Every day | ORAL | 1 refills | Status: AC
  Filled 2024-05-01: qty 90, 90d supply, fill #0
